# Patient Record
Sex: Male | Born: 1935 | Race: White | Hispanic: No | Marital: Married | Smoking: Former smoker
Health system: Southern US, Community
[De-identification: ages and names within clinical notes are randomized; demographics above are authoritative.]

## PROBLEM LIST (undated history)

## (undated) DIAGNOSIS — I10 Essential (primary) hypertension: Secondary | ICD-10-CM

## (undated) DIAGNOSIS — M1990 Unspecified osteoarthritis, unspecified site: Secondary | ICD-10-CM

## (undated) HISTORY — PX: CATARACT EXTRACTION: SUR2

## (undated) HISTORY — PX: APPENDECTOMY: SHX54

## (undated) HISTORY — PX: TONSILLECTOMY: SUR1361

## (undated) HISTORY — PX: OTHER SURGICAL HISTORY: SHX169

## (undated) HISTORY — PX: ADENOIDECTOMY: SHX5191

## (undated) HISTORY — PX: COLONOSCOPY: SHX174

---

## 2004-03-04 ENCOUNTER — Ambulatory Visit: Payer: Self-pay | Admitting: Family Medicine

## 2004-06-11 ENCOUNTER — Ambulatory Visit: Payer: Self-pay | Admitting: Family Medicine

## 2004-09-21 ENCOUNTER — Ambulatory Visit: Payer: Self-pay | Admitting: Family Medicine

## 2005-01-05 ENCOUNTER — Ambulatory Visit: Payer: Self-pay | Admitting: Family Medicine

## 2005-04-20 ENCOUNTER — Ambulatory Visit: Payer: Self-pay | Admitting: Family Medicine

## 2005-07-26 ENCOUNTER — Ambulatory Visit: Payer: Self-pay | Admitting: Family Medicine

## 2005-10-28 ENCOUNTER — Ambulatory Visit: Payer: Self-pay | Admitting: Family Medicine

## 2006-02-03 ENCOUNTER — Ambulatory Visit: Payer: Self-pay | Admitting: Family Medicine

## 2006-03-14 ENCOUNTER — Ambulatory Visit: Payer: Self-pay | Admitting: Physician Assistant

## 2006-03-24 ENCOUNTER — Ambulatory Visit: Payer: Self-pay | Admitting: Family Medicine

## 2006-05-20 ENCOUNTER — Ambulatory Visit: Payer: Self-pay | Admitting: Family Medicine

## 2006-08-25 ENCOUNTER — Ambulatory Visit: Payer: Self-pay | Admitting: Family Medicine

## 2010-04-09 ENCOUNTER — Ambulatory Visit: Admit: 2010-04-09 | Payer: Self-pay | Admitting: Internal Medicine

## 2010-05-13 ENCOUNTER — Ambulatory Visit (INDEPENDENT_AMBULATORY_CARE_PROVIDER_SITE_OTHER): Payer: Medicare HMO | Admitting: Internal Medicine

## 2010-05-13 DIAGNOSIS — D649 Anemia, unspecified: Secondary | ICD-10-CM

## 2010-05-13 DIAGNOSIS — Z7982 Long term (current) use of aspirin: Secondary | ICD-10-CM

## 2010-05-13 DIAGNOSIS — E109 Type 1 diabetes mellitus without complications: Secondary | ICD-10-CM

## 2010-06-11 ENCOUNTER — Ambulatory Visit (HOSPITAL_COMMUNITY)
Admission: RE | Admit: 2010-06-11 | Discharge: 2010-06-11 | Disposition: A | Discharge status: Home / Self Care | Payer: Medicare HMO | Source: Ambulatory Visit | Attending: Internal Medicine | Admitting: Internal Medicine

## 2010-06-11 ENCOUNTER — Other Ambulatory Visit (INDEPENDENT_AMBULATORY_CARE_PROVIDER_SITE_OTHER): Payer: Self-pay | Admitting: Internal Medicine

## 2010-06-11 ENCOUNTER — Encounter (HOSPITAL_BASED_OUTPATIENT_CLINIC_OR_DEPARTMENT_OTHER): Payer: Medicare HMO | Admitting: Internal Medicine

## 2010-06-11 DIAGNOSIS — Z79899 Other long term (current) drug therapy: Secondary | ICD-10-CM | POA: Insufficient documentation

## 2010-06-11 DIAGNOSIS — K5520 Angiodysplasia of colon without hemorrhage: Secondary | ICD-10-CM

## 2010-06-11 DIAGNOSIS — K2270 Barrett's esophagus without dysplasia: Secondary | ICD-10-CM | POA: Insufficient documentation

## 2010-06-11 DIAGNOSIS — R195 Other fecal abnormalities: Secondary | ICD-10-CM

## 2010-06-11 DIAGNOSIS — D649 Anemia, unspecified: Secondary | ICD-10-CM

## 2010-06-11 DIAGNOSIS — E785 Hyperlipidemia, unspecified: Secondary | ICD-10-CM | POA: Insufficient documentation

## 2010-06-11 DIAGNOSIS — K921 Melena: Secondary | ICD-10-CM | POA: Insufficient documentation

## 2010-06-11 DIAGNOSIS — Z7982 Long term (current) use of aspirin: Secondary | ICD-10-CM | POA: Insufficient documentation

## 2010-06-11 DIAGNOSIS — Q2733 Arteriovenous malformation of digestive system vessel: Secondary | ICD-10-CM | POA: Insufficient documentation

## 2010-06-11 DIAGNOSIS — E119 Type 2 diabetes mellitus without complications: Secondary | ICD-10-CM | POA: Insufficient documentation

## 2010-06-11 DIAGNOSIS — D509 Iron deficiency anemia, unspecified: Secondary | ICD-10-CM | POA: Insufficient documentation

## 2010-06-11 DIAGNOSIS — I10 Essential (primary) hypertension: Secondary | ICD-10-CM | POA: Insufficient documentation

## 2010-06-30 NOTE — Op Note (Signed)
NAME:  Blake Collins, Blake Collins                 ACCOUNT NO.:  [id number]  MEDICAL RECORD NO.:  [id number]           PATIENT TYPE:  O  LOCATION:  DAYP                          FACILITY:  [facility]  PHYSICIAN:  Lionel December, M.D.    DATE OF BIRTH:  1935/03/01  DATE OF PROCEDURE: DATE OF DISCHARGE:                              OPERATIVE REPORT   PROCEDURE:  Colonoscopy followed by esophagogastroduodenoscopy.  INDICATIONS:  Blake Collins is a 76 year old Caucasian male who was recently found to have heme-positive stools and subsequently noted to have anemia, with microcytic anemia and iron studies suggesting iron deficiency.  He does not have any GI symptoms other than occasional hematochezia.  He is undergoing diagnostic colonoscopy, if normal, to be followed by esophagogastroduodenoscopy.  The patient says, he is on a study drug through [facility], but no further details are available.  Procedure was reviewed with the patient.  Informed consent was obtained.  MEDICATIONS FOR CONSCIOUS SEDATION:  Demerol 50 mg IV and Versed 5 mg IV.  FINDINGS:  Procedure performed in endoscopy suite.  The patient's vital signs and O2 sat were monitored during the procedure and remained stable.  Procedure #1.  Colonoscopy.  Rectal examination performed.  No abnormality noted on external or digital exam.  Pentax videoscope was placed through rectum and advanced under vision into sigmoid colon and beyond.  Preparation was excellent.  Scope was passed into the cecum which was identified by appendiceal orifice and ileocecal valve.  There was a small AV malformation at cecum without stigmata of bleeding, it was left alone.  Short segment of GI was also examined and it was normal.  As the scope was withdrawn, colonic mucosa was once again carefully examined, and no polyps, tumor masses, or other mucosal abnormalities were noted.  Rectal mucosa was normal.  Scope was retroflexed to examine anorectal junction  which was unremarkable. Endoscope was withdrawn.  The patient was prepared for procedure #2.  Esophagogastroduodenoscopy.  The patient was placed in left lateral recumbent position, and Pentax videoscope was passed via oropharynx without any difficulty into the esophagus.  Mucosa of the esophagus was normal except distally it had about 10 x 15 mm patch of salmon-colored mucosa.  GE junction was located at 40 cm from the incisors.  This patch of abnormal-appearing mucosa was biopsied on the way out for routine histology.  Stomach.  It was empty and distended very well with insufflation.  Folds of proximal stomach are normal.  Examination of mucosa revealed focal, maybe a 2-mm area with edema, but no erosions, ulcers, or other abnormalities noted.  Pyloric channel was patent.  Angularis, fundus, and cardia were examined by retroflexing the scope and were normal.  Duodenum.  Bulbar mucosa was normal.  Scope was passed to second part of duodenal mucosa, and folds were normal.  Endoscope was withdrawn.  The patient tolerated the procedures well.  FINAL DIAGNOSES:  Small cecal arteriovenous malformation, otherwise normal colonoscopy.  Doubt this has anything to do with the patient's iron deficiency anemia or heme-positive stools.  No evidence of peptic ulcer disease or telangiectasia.  Single patch of  salmon-colored mucosa at distal esophagus, which was biopsied for histology to rule out short-segment Barrett's.  RECOMMENDATIONS:  The patient will resume his usual medications and diet as before.  He will start ferrous sulfate 325 mg one twice daily with meals.  I will be contacting the patient with results of biopsy and plan to repeat his H and H in 1 month.     Lionel December, M.D.     NR/MEDQ  D:  06/11/2010  T:  06/11/2010  Job:  [id number]  cc:   Blake Collins, M.D. Fax: [phone]  Electronically Signed by Lionel December M.D. on 06/30/2010 11:33:00 AM

## 2010-06-30 NOTE — Consult Note (Signed)
NAME:  Blake Collins, Blake Collins                 ACCOUNT NO.:  [id number]  MEDICAL RECORD NO.:  [id number]           PATIENT TYPE:  LOCATION:    Office                            FACILITY:  PHYSICIAN:  Lionel December, M.D.    DATE OF BIRTH:  1934/11/07  DATE OF CONSULTATION: DATE OF DISCHARGE:                                CONSULTATION   REASON FOR CONSULTATION:  Anemia.  HISTORY OF PRESENT ILLNESS:  Mr. Blake Collins is a 76 year old male referred to our office by Dr. Lysbeth Galas for anemia.  In November 2011, it was noted his hemoglobin was 10.8 and hematocrit was 34.4.  On December 7 it was noted his hemoglobin was 10.7 and hematocrit was 35.9, his MCV was 75. On December 13; his hemoglobin was 10.3, hematocrit was 36.2 and his MCV was 78.3.  Iron studies, his iron was 34, TIBC was 435, percent of saturation was 8.  Sodium 139, potassium 4.4, chloride 103, CO2 24, glucose 155, BUN 14, creatinine 0.78, total bili 0.9, ALP 59, AST 21, ALT 18, total protein 6.4, albumin 4.2 and calcium was 9.0.  He was started on iron in December and has finished since then.  Mr. Blake Collins denies a prior history of anemia.  His appetite has been good.  He has had no weight loss.  He has a bowel movement once a day and sometimes two.  His stools are brown in color, normal caliber.  He does state that he did have a positive stool for blood in Dr. Joyce Copa office recently. He denies any acid reflux.  No abdominal pain.  His last colonoscopy was in 2008 by Dr. Cleotis Nipper, screening which revealed a normal colonoscopy. There was no diverticulosis, no colonic or rectal polyps, normal colon otherwise.  There is no known allergies.  HOME MEDICATIONS: 1. Metformin 1000 mg twice a day. 2. Hydrocodone as needed. 3. Glipizide 10 mg a day. 4. Lisinopril 20 mg a day. 5. Clonazepam 1 mg at night. 6. Lipitor 40 mg a day. 7. Aspirin 81 mg.  SURGERIES:  He has had right cataract surgery and he has had upper eyelid surgery.  MEDICAL  HISTORY:  Diabetes type 2 for 10 years and high cholesterol.  FAMILY HISTORY:  His mother and father deceased from natural causes.  He has 4 sisters, 2 were deceased, 1 form kidney failure and from natural causes, 2 are in good health except 1 has skin cancer and 1 has mental problems.  He has 1 brother with diverticulosis and diabetes.  He is married.  He is retired from [employer].  He does not smoke, drink or do drugs and he has 3 children in good health.  OBJECTIVE:  VITAL SIGNS:  His weight is 293, his height is 5 feet 9-1/2 inches, temperature is 98, blood pressure is 170/90, his pulse is 80. HEENT:  He has lower dentures.  He does not wear.  He has natural teeth in the lower.  His oral mucosa is moist.  There are no lesions.  His conjunctivae are pink.  His sclerae are anicteric. NECK:  His thyroid is normal.  There is no  cervical lymphadenopathy. LUNGS:  Clear. ABDOMEN:  Soft.  Bowel sounds are positive.  No masses, obese. HEART:  Regular rate and rhythm.  There were no stools in the rectum and it was guaiac negative.ASSESSMENT:  Mr. Blake Collins is a 76 year old male with a new onset diagnosis of anemia.  He did have a guaiac positive stool with Dr. Lysbeth Galas recently.  A colonic neoplasm needs to be ruled out.  Peptic ulcer disease also was in the differential as well as rectal AVM or colonic AVM or ulcer.  RECOMMENDATIONS:  We will schedule a colonoscopy with Dr. Karilyn Cota in the near future.  If the colonoscopy is normal, he will need an EGD and the risk and benefits were reviewed with the patient and he is agreeable.    ______________________________ Dorene Ar, NP   ______________________________ Lionel December, M.D.    TS/MEDQ  D:  05/13/2010  T:  05/14/2010  Job:  [id number]  Electronically Signed by Dorene Ar PA on 06/12/2010 10:53:07 AM Electronically Signed by Lionel December M.D. on 06/30/2010 11:32:09 AM

## 2011-02-17 ENCOUNTER — Encounter: Payer: Self-pay | Admitting: *Deleted

## 2011-02-17 ENCOUNTER — Emergency Department (HOSPITAL_COMMUNITY): Payer: Medicare HMO

## 2011-02-17 ENCOUNTER — Emergency Department (HOSPITAL_COMMUNITY)
Admission: EM | Admit: 2011-02-17 | Discharge: 2011-02-17 | Disposition: A | Discharge status: Home / Self Care | Payer: Medicare HMO | Attending: Emergency Medicine | Admitting: Emergency Medicine

## 2011-02-17 DIAGNOSIS — E119 Type 2 diabetes mellitus without complications: Secondary | ICD-10-CM | POA: Insufficient documentation

## 2011-02-17 DIAGNOSIS — M545 Low back pain, unspecified: Secondary | ICD-10-CM | POA: Insufficient documentation

## 2011-02-17 DIAGNOSIS — M129 Arthropathy, unspecified: Secondary | ICD-10-CM | POA: Insufficient documentation

## 2011-02-17 DIAGNOSIS — Z87891 Personal history of nicotine dependence: Secondary | ICD-10-CM | POA: Insufficient documentation

## 2011-02-17 DIAGNOSIS — K8020 Calculus of gallbladder without cholecystitis without obstruction: Secondary | ICD-10-CM | POA: Insufficient documentation

## 2011-02-17 DIAGNOSIS — I10 Essential (primary) hypertension: Secondary | ICD-10-CM | POA: Insufficient documentation

## 2011-02-17 DIAGNOSIS — R109 Unspecified abdominal pain: Secondary | ICD-10-CM | POA: Insufficient documentation

## 2011-02-17 DIAGNOSIS — N200 Calculus of kidney: Secondary | ICD-10-CM | POA: Insufficient documentation

## 2011-02-17 HISTORY — DX: Essential (primary) hypertension: I10

## 2011-02-17 HISTORY — DX: Unspecified osteoarthritis, unspecified site: M19.90

## 2011-02-17 LAB — URINALYSIS, ROUTINE W REFLEX MICROSCOPIC
Glucose, UA: NEGATIVE mg/dL
Protein, ur: NEGATIVE mg/dL
Specific Gravity, Urine: 1.015 (ref 1.005–1.030)
pH: 6 (ref 5.0–8.0)

## 2011-02-17 LAB — URINE MICROSCOPIC-ADD ON

## 2011-02-17 MED ORDER — CYCLOBENZAPRINE HCL 5 MG PO TABS: 5.0000 mg | ORAL_TABLET | Freq: Three times a day (TID) | ORAL | Status: AC | PRN

## 2011-02-17 MED ORDER — OXYCODONE-ACETAMINOPHEN 5-325 MG PO TABS: 1.0000 | ORAL_TABLET | Freq: Four times a day (QID) | ORAL | Status: AC | PRN

## 2011-02-17 MED ORDER — OXYCODONE-ACETAMINOPHEN 5-325 MG PO TABS
1.0000 | ORAL_TABLET | Freq: Once | ORAL | Status: AC
  Administered 2011-02-17: 1 via ORAL
  Filled 2011-02-17: qty 1

## 2011-02-17 NOTE — ED Notes (Signed)
Pt c/o r lower back pain x 5 days.  Denies injury.  Denies urinary symptoms.  Denies history of kidney stones.  Reports pain is worse when he moves.  Says feels like it catches when he walks .

## 2011-02-17 NOTE — ED Notes (Signed)
Pt c/o lower back pain that started a few days ago, worse during the night, denies any injury, states that he has had back problems before but nothing like this,

## 2011-02-17 NOTE — ED Provider Notes (Signed)
History     CSN: [id number] Arrival date & time: 02/17/2011  1:03 PM   First MD Initiated Contact with Patient 02/17/11 1307      Chief Complaint  Patient presents with  . Back Pain  . Flank Pain    (Consider location/radiation/quality/duration/timing/severity/associated sxs/prior treatment) HPI Patient presents with complaint of right-sided low back pain. He states the pain has been present for approximately 5 days and has been gradually worsening. He denies any fever or loss of control of bowel or bladder. He denies any weakness of his leg. He denies any blood in his urine or dysuria. He does not know of any trauma or changes in his activities. He has had a milder version of this pain in the past. Movement and palpation make the pain worse. He has not tried any medications to help with the pain.  Past Medical History  Diagnosis Date  . Diabetes mellitus   . Arthritis   . Hypertension     Past Surgical History  Procedure Date  . Cataract extraction   . Appendectomy   . Tonsillectomy   . Adenoidectomy   . Colonoscopy   . Endo     History reviewed. No pertinent family history.  History  Substance Use Topics  . Smoking status: Former Games developer  . Smokeless tobacco: Not on file  . Alcohol Use: No      Review of Systems ROS reviewed and otherwise negative except for mentioned in HPI  Allergies  Review of patient's allergies indicates no known allergies.  Home Medications  No current outpatient prescriptions on file.  BP 143/71  Pulse 85  Temp(Src) 97.5 F (36.4 C) (Oral)  Resp 20  Ht 5' 9.5" (1.765 m)  Wt 270 lb (122.471 kg)  BMI 39.30 kg/m2  SpO2 97% Vitals reviewed Physical Exam Physical Examination: General appearance - alert, well appearing, and in no distress Mental status - alert, oriented to person, place, and time Chest - clear to auscultation, no wheezes, rales or rhonchi, symmetric air entry Heart - normal rate, regular rhythm, normal S1, S2,  no murmurs, rubs, clicks or gallops Abdomen - soft, nontender, nondistended, no masses or organomegaly Back exam - moderate ttp over lumbar spine, and right paraspinous muscles, no CVA tenderness Neurological - alert, oriented, normal speech, no focal findings or movement disorder noted, 5/5 strength in lower extremities bilaterally, sensation intact to light touch Musculoskeletal - no joint tenderness, deformity or swelling Extremities - peripheral pulses normal, no pedal edema, no clubbing or cyanosis Skin - normal coloration and turgor, no rashes  ED Course  Procedures (including critical care time)   Labs Reviewed  URINALYSIS, ROUTINE W REFLEX MICROSCOPIC   No results found.   No diagnosis found.    MDM  Patient presenting with low right-sided back pain which has been present approximately 5 days. Lumbar spine films reveals some degenerative changes but no acute findings. Urine did have blood present and there was question of renal stone on plain film. CT of the abdomen revealed cholelithiasis and small renal stone but no ureteral stone or hydronephrosis. I suspect his pain is due to musculoskeletal the back pain. Will discharge with pain control and muscle relaxation. He was given strict return precautions and he is agreeable with this plan.  He was also notified of findings of cholelithiasis on CT scan.  He has no signs or symptoms of cauda equina        Ethelda Chick, MD 02/17/11 [phone]

## 2013-02-13 ENCOUNTER — Emergency Department (HOSPITAL_COMMUNITY): Payer: Medicare Other

## 2013-02-13 ENCOUNTER — Emergency Department (HOSPITAL_COMMUNITY)
Admission: EM | Admit: 2013-02-13 | Discharge: 2013-02-13 | Disposition: A | Discharge status: Home / Self Care | Payer: Medicare Other | Attending: Emergency Medicine | Admitting: Emergency Medicine

## 2013-02-13 ENCOUNTER — Encounter (HOSPITAL_COMMUNITY): Payer: Self-pay | Admitting: Emergency Medicine

## 2013-02-13 DIAGNOSIS — Z9089 Acquired absence of other organs: Secondary | ICD-10-CM | POA: Insufficient documentation

## 2013-02-13 DIAGNOSIS — M129 Arthropathy, unspecified: Secondary | ICD-10-CM | POA: Insufficient documentation

## 2013-02-13 DIAGNOSIS — Z7982 Long term (current) use of aspirin: Secondary | ICD-10-CM | POA: Insufficient documentation

## 2013-02-13 DIAGNOSIS — Z79899 Other long term (current) drug therapy: Secondary | ICD-10-CM | POA: Insufficient documentation

## 2013-02-13 DIAGNOSIS — E119 Type 2 diabetes mellitus without complications: Secondary | ICD-10-CM | POA: Insufficient documentation

## 2013-02-13 DIAGNOSIS — Z9889 Other specified postprocedural states: Secondary | ICD-10-CM | POA: Insufficient documentation

## 2013-02-13 DIAGNOSIS — K625 Hemorrhage of anus and rectum: Secondary | ICD-10-CM | POA: Insufficient documentation

## 2013-02-13 DIAGNOSIS — Z87891 Personal history of nicotine dependence: Secondary | ICD-10-CM | POA: Insufficient documentation

## 2013-02-13 DIAGNOSIS — K5900 Constipation, unspecified: Secondary | ICD-10-CM | POA: Insufficient documentation

## 2013-02-13 DIAGNOSIS — R109 Unspecified abdominal pain: Secondary | ICD-10-CM | POA: Insufficient documentation

## 2013-02-13 DIAGNOSIS — I10 Essential (primary) hypertension: Secondary | ICD-10-CM | POA: Insufficient documentation

## 2013-02-13 LAB — CBC WITH DIFFERENTIAL/PLATELET
Basophils Relative: 0 % (ref 0–1)
Eosinophils Absolute: 0.1 10*3/uL (ref 0.0–0.7)
Lymphs Abs: 1.5 10*3/uL (ref 0.7–4.0)
MCH: 29.8 pg (ref 26.0–34.0)
Neutrophils Relative %: 78 % — ABNORMAL HIGH (ref 43–77)
Platelets: 156 10*3/uL (ref 150–400)
RBC: 4.8 MIL/uL (ref 4.22–5.81)

## 2013-02-13 LAB — COMPREHENSIVE METABOLIC PANEL
ALT: 22 U/L (ref 0–53)
Albumin: 3.9 g/dL (ref 3.5–5.2)
Alkaline Phosphatase: 61 U/L (ref 39–117)
Glucose, Bld: 111 mg/dL — ABNORMAL HIGH (ref 70–99)
Potassium: 4.3 mEq/L (ref 3.5–5.1)
Sodium: 136 mEq/L (ref 135–145)
Total Protein: 7.2 g/dL (ref 6.0–8.3)

## 2013-02-13 MED ORDER — IOHEXOL 300 MG/ML  SOLN
100.0000 mL | Freq: Once | INTRAMUSCULAR | Status: AC | PRN
  Administered 2013-02-13: 100 mL via INTRAVENOUS

## 2013-02-13 MED ORDER — IOHEXOL 300 MG/ML  SOLN
50.0000 mL | Freq: Once | INTRAMUSCULAR | Status: AC | PRN
  Administered 2013-02-13: 50 mL via ORAL

## 2013-02-13 NOTE — ED Notes (Addendum)
Pt with abd pain and states LNBM was the other day, took enema just PTA today, denies N/V

## 2013-02-13 NOTE — ED Provider Notes (Addendum)
CSN: [id number]     Arrival date & time 02/13/13  1518 History  This chart was scribed for Gilda Crease, * by Ronal Fear, ED Scribe. This patient was seen in room [facility] and the patient's care was started at 3:31 PM.    Chief Complaint  Patient presents with  . Abdominal Pain  . Constipation   (Consider location/radiation/quality/duration/timing/severity/associated sxs/prior Treatment) The history is provided by the patient. No language interpreter was used.    HPI Comments: Blake Collins is a 78 y.o. male who presents to the Emergency Department complaining of crampy bilateral lower abdominal pain and the feeling of constipation that is worse with attempting BM onset yesterday. He has given himself an enema with no relief.  Pt states that there has been some liquid discharge with a saw dust like secretion and traces of blood. He denies emesis or fever, CP or trouble breathing. He has had no prior occurences with a similar constipation. He denies a hx of diverticulitis. But, he has a family hx of diverticulitis. Pt has had a colonoscopy in the past with no remarkable results.   Past Medical History  Diagnosis Date  . Diabetes mellitus   . Arthritis   . Hypertension    Past Surgical History  Procedure Laterality Date  . Cataract extraction    . Appendectomy    . Tonsillectomy    . Adenoidectomy    . Colonoscopy    . Endo     History reviewed. No pertinent family history. History  Substance Use Topics  . Smoking status: Former Games developer  . Smokeless tobacco: Not on file  . Alcohol Use: No    Review of Systems  Constitutional: Negative for fever.  Respiratory: Negative for shortness of breath.   Cardiovascular: Negative for chest pain.  Gastrointestinal: Positive for abdominal pain and constipation. Negative for vomiting and diarrhea.  All other systems reviewed and are negative.    Allergies  Review of patient's allergies indicates no known  allergies.  Home Medications   Current Outpatient Rx  Name  Route  Sig  Dispense  Refill  . acetaminophen (TYLENOL) 650 MG CR tablet   Oral   Take 1,300 mg by mouth every 8 (eight) hours as needed. For pain          . aspirin EC 81 MG tablet   Oral   Take 81 mg by mouth daily.           [name] atorvastatin (LIPITOR) 40 MG tablet   Oral   Take 40 mg by mouth at bedtime.           . clonazePAM (KLONOPIN) 1 MG tablet   Oral   Take 1 mg by mouth at bedtime.           [name] glipiZIDE (GLUCOTROL XL) 10 MG 24 hr tablet   Oral   Take 10 mg by mouth daily.           [name] HYDROcodone-acetaminophen (VICODIN) 5-500 MG per tablet   Oral   Take 1 tablet by mouth 2 (two) times daily as needed. For pain          . lisinopril (PRINIVIL,ZESTRIL) 20 MG tablet   Oral   Take 20 mg by mouth daily.           [name] loratadine (CLARITIN) 10 MG tablet   Oral   Take 10 mg by mouth daily.           . metFORMIN (  GLUCOPHAGE) 1000 MG tablet   Oral   Take 1,000 mg by mouth 2 (two) times daily with a meal.            BP 170/81  Pulse 100  Temp(Src) 97.6 F (36.4 C) (Oral)  Resp 20  Ht 5\' 10"  (1.778 m)  Wt 240 lb (108.863 kg)  BMI 34.44 kg/m2  SpO2 97% Physical Exam  Constitutional: He is oriented to person, place, and time. He appears well-developed and well-nourished. No distress.  HENT:  Head: Normocephalic and atraumatic.  Right Ear: Hearing normal.  Left Ear: Hearing normal.  Nose: Nose normal.  Mouth/Throat: Oropharynx is clear and moist and mucous membranes are normal.  Eyes: Conjunctivae and EOM are normal. Pupils are equal, round, and reactive to light.  Neck: Normal range of motion. Neck supple.  Cardiovascular: Regular rhythm, S1 normal and S2 normal.  Exam reveals no gallop and no friction rub.   No murmur heard. Pulmonary/Chest: Effort normal and breath sounds normal. No respiratory distress. He exhibits no tenderness.  Abdominal: Soft. Normal appearance and bowel sounds  are normal. There is no hepatosplenomegaly. There is no tenderness. There is no rebound, no guarding, no tenderness at McBurney's point and negative Murphy's sign. No hernia.  Genitourinary: Rectal exam shows no mass, no tenderness and anal tone normal. Guaiac positive stool.  No impaction noted  Musculoskeletal: Normal range of motion.  Neurological: He is alert and oriented to person, place, and time. He has normal strength. No cranial nerve deficit or sensory deficit. Coordination normal. GCS eye subscore is 4. GCS verbal subscore is 5. GCS motor subscore is 6.  Skin: Skin is warm, dry and intact. No rash noted. No cyanosis.  Psychiatric: He has a normal mood and affect. His speech is normal and behavior is normal. Thought content normal.    ED Course  Procedures (including critical care time) DIAGNOSTIC STUDIES: Oxygen Saturation is 97% on RA, normal by my interpretation.    COORDINATION OF CARE:    3:34 PM- Pt advised of plan for treatment including CBC, X-ray and rectal exam and pt agrees.  Labs Review Labs Reviewed  CBC WITH DIFFERENTIAL - Abnormal; Notable for the following:    Neutrophils Relative % 78 (*)    Neutro Abs 7.8 (*)    All other components within normal limits  COMPREHENSIVE METABOLIC PANEL - Abnormal; Notable for the following:    Glucose, Bld 111 (*)    GFR calc non Af Amer 88 (*)    All other components within normal limits  LIPASE, BLOOD   Imaging Review Ct Abdomen Pelvis W Contrast  02/13/2013   CLINICAL DATA:  Lower abdominal cramping pain. Constipation. Positive Hemoccult test. Diabetes.  EXAM: CT ABDOMEN AND PELVIS WITH CONTRAST  TECHNIQUE: Multidetector CT imaging of the abdomen and pelvis was performed using the standard protocol following bolus administration of intravenous contrast.  CONTRAST:  100 mL OMNIPAQUE IOHEXOL 300 MG/ML SOLN, OMNIPAQUE IOHEXOL 300 MG/ML SOLN  COMPARISON:  02/13/2013 radiographs; CT scan of 02/17/2011.  FINDINGS:  Stable 2.5 x 1.6 cm cyst, segment 2 of the liver. Stable tiny hypodense lesion in segment 4 a.  The spleen, pancreas, and adrenal glands appear normal. Stable bilateral renal cysts. No renal calculi or ureteral calculi. Urinary bladder unremarkable. Prominent prostate gland measures 5.5 by 4.6 cm.  Scattered small peripancreatic lymph nodes are not pathologically enlarged. An 8 mm gallstone is present in the gallbladder. No biliary dilatation.  Aortoiliac atherosclerotic vascular disease noted.  No pathologic pelvic adenopathy is observed.  Appendix normal. No dilated bowel. Degenerative disk disease and spondylosis noted at L5-S1.  IMPRESSION: 1. Stable hepatic and renal cysts. 2. Cholelithiasis. 3. Spondylosis and degenerative disc disease at L5-S1. 4. Atherosclerosis. 5. Prominent prostate gland.   Electronically Signed   By: Herbie Baltimore M.D.   On: 02/13/2013 19:03   Dg Abd Acute W/chest  02/13/2013   CLINICAL DATA:  Abdominal distention and pain.  EXAM: ACUTE ABDOMEN SERIES (ABDOMEN 2 VIEW & CHEST 1 VIEW)  COMPARISON:  CT abdomen and pelvis 02/17/2011.  FINDINGS: Single view of the chest demonstrates clear lungs and normal heart size. No pneumothorax or pleural fluid is identified.  Two views of the abdomen show no free intraperitoneal air. The bowel gas pattern is unremarkable. Single rounded calcification in the right upper quadrant measuring 0.8 cm in diameter is consistent with a gallstone seen on the prior CT.  IMPRESSION: No acute finding chest or abdomen.  Single gallstone as seen on prior CT scan.   Electronically Signed   By: Drusilla Kanner M.D.   On: 02/13/2013 17:26    EKG Interpretation   None       MDM  Diagnosis: Abdominal pain  Patient presents to ER with complaints of abdominal pain. Patient complains of cramping in his lower abdomen that is present when he tries to defecate. He feels constipated, but cannot have a bowel movement. Last normal bowel movement was several days  ago. Rectal exam did not reveal any fecal impaction. X-ray was unremarkable and blood work was normal. Based on this CAT scan was performed to further evaluate for diverticulitis, colitis, other etiologies. CAT scan does not show any acute process. Patient reassured, no further enemas or laxatives to be used.   Patient's rectal exam was heme positive. He has seen some streaking of blood in stools. He reports a previous colonoscopy several years ago in [place]. He was told it was normal. Patient will require followup, likely colonoscopy. Will be referred to GI.  I personally performed the services described in this documentation, which was scribed in my presence. The recorded information has been reviewed and is accurate.    Gilda Crease, MD 02/13/13 1940  Gilda Crease, MD 02/13/13 [id number]

## 2019-06-23 ENCOUNTER — Emergency Department (HOSPITAL_COMMUNITY): Payer: Medicare HMO

## 2019-06-23 ENCOUNTER — Inpatient Hospital Stay (HOSPITAL_COMMUNITY)
Admission: EM | Admit: 2019-06-23 | Discharge: 2019-07-07 | DRG: 177 | Disposition: A | Discharge status: Skilled Nursing Facility | Payer: Medicare HMO | Attending: Family Medicine | Admitting: Family Medicine

## 2019-06-23 ENCOUNTER — Encounter (HOSPITAL_COMMUNITY): Payer: Self-pay | Admitting: Emergency Medicine

## 2019-06-23 ENCOUNTER — Other Ambulatory Visit: Payer: Self-pay

## 2019-06-23 DIAGNOSIS — Z7984 Long term (current) use of oral hypoglycemic drugs: Secondary | ICD-10-CM

## 2019-06-23 DIAGNOSIS — Z23 Encounter for immunization: Secondary | ICD-10-CM | POA: Diagnosis not present

## 2019-06-23 DIAGNOSIS — I2699 Other pulmonary embolism without acute cor pulmonale: Secondary | ICD-10-CM | POA: Diagnosis not present

## 2019-06-23 DIAGNOSIS — M1990 Unspecified osteoarthritis, unspecified site: Secondary | ICD-10-CM | POA: Diagnosis present

## 2019-06-23 DIAGNOSIS — J440 Chronic obstructive pulmonary disease with acute lower respiratory infection: Secondary | ICD-10-CM | POA: Diagnosis present

## 2019-06-23 DIAGNOSIS — R4182 Altered mental status, unspecified: Secondary | ICD-10-CM | POA: Diagnosis not present

## 2019-06-23 DIAGNOSIS — I1310 Hypertensive heart and chronic kidney disease without heart failure, with stage 1 through stage 4 chronic kidney disease, or unspecified chronic kidney disease: Secondary | ICD-10-CM | POA: Diagnosis present

## 2019-06-23 DIAGNOSIS — Z9849 Cataract extraction status, unspecified eye: Secondary | ICD-10-CM

## 2019-06-23 DIAGNOSIS — R413 Other amnesia: Secondary | ICD-10-CM | POA: Diagnosis not present

## 2019-06-23 DIAGNOSIS — K922 Gastrointestinal hemorrhage, unspecified: Secondary | ICD-10-CM

## 2019-06-23 DIAGNOSIS — U071 COVID-19: Principal | ICD-10-CM | POA: Diagnosis present

## 2019-06-23 DIAGNOSIS — D649 Anemia, unspecified: Secondary | ICD-10-CM | POA: Diagnosis present

## 2019-06-23 DIAGNOSIS — Z87891 Personal history of nicotine dependence: Secondary | ICD-10-CM

## 2019-06-23 DIAGNOSIS — K921 Melena: Secondary | ICD-10-CM | POA: Diagnosis present

## 2019-06-23 DIAGNOSIS — E785 Hyperlipidemia, unspecified: Secondary | ICD-10-CM | POA: Diagnosis present

## 2019-06-23 DIAGNOSIS — F05 Delirium due to known physiological condition: Secondary | ICD-10-CM | POA: Diagnosis not present

## 2019-06-23 DIAGNOSIS — F0390 Unspecified dementia without behavioral disturbance: Secondary | ICD-10-CM | POA: Diagnosis present

## 2019-06-23 DIAGNOSIS — I454 Nonspecific intraventricular block: Secondary | ICD-10-CM | POA: Diagnosis present

## 2019-06-23 DIAGNOSIS — R7989 Other specified abnormal findings of blood chemistry: Secondary | ICD-10-CM

## 2019-06-23 DIAGNOSIS — E869 Volume depletion, unspecified: Secondary | ICD-10-CM | POA: Diagnosis present

## 2019-06-23 DIAGNOSIS — Z79899 Other long term (current) drug therapy: Secondary | ICD-10-CM

## 2019-06-23 DIAGNOSIS — Z8719 Personal history of other diseases of the digestive system: Secondary | ICD-10-CM

## 2019-06-23 DIAGNOSIS — Z9089 Acquired absence of other organs: Secondary | ICD-10-CM

## 2019-06-23 DIAGNOSIS — G8929 Other chronic pain: Secondary | ICD-10-CM | POA: Diagnosis present

## 2019-06-23 DIAGNOSIS — J9600 Acute respiratory failure, unspecified whether with hypoxia or hypercapnia: Secondary | ICD-10-CM | POA: Diagnosis not present

## 2019-06-23 DIAGNOSIS — J9601 Acute respiratory failure with hypoxia: Secondary | ICD-10-CM | POA: Diagnosis present

## 2019-06-23 DIAGNOSIS — G9341 Metabolic encephalopathy: Secondary | ICD-10-CM | POA: Diagnosis not present

## 2019-06-23 DIAGNOSIS — E1122 Type 2 diabetes mellitus with diabetic chronic kidney disease: Secondary | ICD-10-CM | POA: Diagnosis present

## 2019-06-23 DIAGNOSIS — N1832 Chronic kidney disease, stage 3b: Secondary | ICD-10-CM | POA: Diagnosis present

## 2019-06-23 DIAGNOSIS — N179 Acute kidney failure, unspecified: Secondary | ICD-10-CM | POA: Diagnosis present

## 2019-06-23 DIAGNOSIS — M545 Low back pain: Secondary | ICD-10-CM | POA: Diagnosis present

## 2019-06-23 DIAGNOSIS — R531 Weakness: Secondary | ICD-10-CM

## 2019-06-23 DIAGNOSIS — Z7982 Long term (current) use of aspirin: Secondary | ICD-10-CM

## 2019-06-23 DIAGNOSIS — H9190 Unspecified hearing loss, unspecified ear: Secondary | ICD-10-CM | POA: Diagnosis present

## 2019-06-23 DIAGNOSIS — J1282 Pneumonia due to coronavirus disease 2019: Secondary | ICD-10-CM | POA: Diagnosis present

## 2019-06-23 DIAGNOSIS — Z79891 Long term (current) use of opiate analgesic: Secondary | ICD-10-CM

## 2019-06-23 DIAGNOSIS — Z781 Physical restraint status: Secondary | ICD-10-CM

## 2019-06-23 DIAGNOSIS — Z9049 Acquired absence of other specified parts of digestive tract: Secondary | ICD-10-CM

## 2019-06-23 DIAGNOSIS — G4733 Obstructive sleep apnea (adult) (pediatric): Secondary | ICD-10-CM | POA: Diagnosis present

## 2019-06-23 DIAGNOSIS — R8281 Pyuria: Secondary | ICD-10-CM | POA: Diagnosis present

## 2019-06-23 LAB — CBC WITH DIFFERENTIAL/PLATELET
Abs Immature Granulocytes: 0.1 10*3/uL — ABNORMAL HIGH (ref 0.00–0.07)
Basophils Absolute: 0 10*3/uL (ref 0.0–0.1)
Basophils Relative: 0 %
Eosinophils Absolute: 0 10*3/uL (ref 0.0–0.5)
Eosinophils Relative: 0 %
HCT: 32.5 % — ABNORMAL LOW (ref 39.0–52.0)
Hemoglobin: 10 g/dL — ABNORMAL LOW (ref 13.0–17.0)
Immature Granulocytes: 1 %
Lymphocytes Relative: 9 %
Lymphs Abs: 0.8 10*3/uL (ref 0.7–4.0)
MCH: 25.9 pg — ABNORMAL LOW (ref 26.0–34.0)
MCHC: 30.8 g/dL (ref 30.0–36.0)
MCV: 84.2 fL (ref 80.0–100.0)
Monocytes Absolute: 0.7 10*3/uL (ref 0.1–1.0)
Monocytes Relative: 8 %
Neutro Abs: 6.7 10*3/uL (ref 1.7–7.7)
Neutrophils Relative %: 82 %
Platelets: 277 10*3/uL (ref 150–400)
RBC: 3.86 MIL/uL — ABNORMAL LOW (ref 4.22–5.81)
RDW: 15.6 % — ABNORMAL HIGH (ref 11.5–15.5)
WBC: 8.3 10*3/uL (ref 4.0–10.5)
nRBC: 0 % (ref 0.0–0.2)

## 2019-06-23 LAB — COMPREHENSIVE METABOLIC PANEL
ALT: 13 U/L (ref 0–44)
AST: 28 U/L (ref 15–41)
Albumin: 2.9 g/dL — ABNORMAL LOW (ref 3.5–5.0)
Alkaline Phosphatase: 76 U/L (ref 38–126)
Anion gap: 12 (ref 5–15)
BUN: 37 mg/dL — ABNORMAL HIGH (ref 8–23)
CO2: 20 mmol/L — ABNORMAL LOW (ref 22–32)
Calcium: 8.5 mg/dL — ABNORMAL LOW (ref 8.9–10.3)
Chloride: 108 mmol/L (ref 98–111)
Creatinine, Ser: 1.85 mg/dL — ABNORMAL HIGH (ref 0.61–1.24)
GFR calc Af Amer: 38 mL/min — ABNORMAL LOW (ref >60)
GFR calc non Af Amer: 33 mL/min — ABNORMAL LOW (ref >60)
Glucose, Bld: 134 mg/dL — ABNORMAL HIGH (ref 70–99)
Potassium: 4.1 mmol/L (ref 3.5–5.1)
Sodium: 140 mmol/L (ref 135–145)
Total Bilirubin: 0.7 mg/dL (ref 0.3–1.2)
Total Protein: 7.3 g/dL (ref 6.5–8.1)

## 2019-06-23 LAB — TYPE AND SCREEN
ABO/RH(D): A POS
Antibody Screen: NEGATIVE

## 2019-06-23 LAB — RESPIRATORY PANEL BY RT PCR (FLU A&B, COVID)
Influenza A by PCR: NEGATIVE
Influenza B by PCR: NEGATIVE
SARS Coronavirus 2 by RT PCR: POSITIVE — AB

## 2019-06-23 LAB — URINALYSIS, ROUTINE W REFLEX MICROSCOPIC
Bacteria, UA: NONE SEEN
Bilirubin Urine: NEGATIVE
Glucose, UA: NEGATIVE mg/dL
Hgb urine dipstick: NEGATIVE
Ketones, ur: NEGATIVE mg/dL
Nitrite: NEGATIVE
Protein, ur: 30 mg/dL — AB
Specific Gravity, Urine: 1.02 (ref 1.005–1.030)
WBC, UA: >50 WBC/hpf — ABNORMAL HIGH (ref 0–5)
pH: 5 (ref 5.0–8.0)

## 2019-06-23 LAB — GLUCOSE, CAPILLARY
Glucose-Capillary: 110 mg/dL — ABNORMAL HIGH (ref 70–99)
Glucose-Capillary: 156 mg/dL — ABNORMAL HIGH (ref 70–99)

## 2019-06-23 LAB — POC SARS CORONAVIRUS 2 AG -  ED: SARS Coronavirus 2 Ag: NEGATIVE

## 2019-06-23 LAB — FERRITIN: Ferritin: 97 ng/mL (ref 24–336)

## 2019-06-23 LAB — HEMOGLOBIN A1C
Hgb A1c MFr Bld: 6.4 % — ABNORMAL HIGH (ref 4.8–5.6)
Mean Plasma Glucose: 136.98 mg/dL

## 2019-06-23 LAB — VITAMIN B12: Vitamin B-12: 1289 pg/mL — ABNORMAL HIGH (ref 180–914)

## 2019-06-23 LAB — PROCALCITONIN: Procalcitonin: 0.2 ng/mL

## 2019-06-23 LAB — TSH: TSH: 1.296 u[IU]/mL (ref 0.350–4.500)

## 2019-06-23 LAB — FOLATE: Folate: 15.1 ng/mL (ref >5.9)

## 2019-06-23 LAB — LACTIC ACID, PLASMA: Lactic Acid, Venous: 1.5 mmol/L (ref 0.5–1.9)

## 2019-06-23 LAB — C-REACTIVE PROTEIN: CRP: 20.8 mg/dL — ABNORMAL HIGH (ref <1.0)

## 2019-06-23 LAB — T4, FREE: Free T4: 1.03 ng/dL (ref 0.61–1.12)

## 2019-06-23 LAB — TROPONIN I (HIGH SENSITIVITY): Troponin I (High Sensitivity): 15 ng/L (ref <18)

## 2019-06-23 LAB — LACTATE DEHYDROGENASE: LDH: 253 U/L — ABNORMAL HIGH (ref 98–192)

## 2019-06-23 LAB — FIBRINOGEN: Fibrinogen: >800 mg/dL — ABNORMAL HIGH (ref 210–475)

## 2019-06-23 LAB — D-DIMER, QUANTITATIVE: D-Dimer, Quant: 3.31 ug/mL-FEU — ABNORMAL HIGH (ref 0.00–0.50)

## 2019-06-23 LAB — POC OCCULT BLOOD, ED: Fecal Occult Bld: POSITIVE — AB

## 2019-06-23 LAB — BRAIN NATRIURETIC PEPTIDE: B Natriuretic Peptide: 107 pg/mL — ABNORMAL HIGH (ref 0.0–100.0)

## 2019-06-23 MED ORDER — ONDANSETRON HCL 4 MG/2ML IJ SOLN: 4.0000 mg | Freq: Four times a day (QID) | INTRAMUSCULAR | Status: DC | PRN

## 2019-06-23 MED ORDER — ONDANSETRON HCL 4 MG PO TABS: 4.0000 mg | ORAL_TABLET | Freq: Four times a day (QID) | ORAL | Status: DC | PRN

## 2019-06-23 MED ORDER — SODIUM CHLORIDE 0.9 % IV SOLN
100.0000 mg | Freq: Every day | INTRAVENOUS | Status: AC
  Administered 2019-06-24 – 2019-06-27 (×4): 100 mg via INTRAVENOUS
  Filled 2019-06-23 (×4): qty 20

## 2019-06-23 MED ORDER — PANTOPRAZOLE SODIUM 40 MG PO TBEC
40.0000 mg | DELAYED_RELEASE_TABLET | Freq: Every day | ORAL | Status: DC
  Administered 2019-06-23 – 2019-07-07 (×8): 40 mg via ORAL
  Filled 2019-06-23 (×15): qty 1

## 2019-06-23 MED ORDER — IPRATROPIUM-ALBUTEROL 20-100 MCG/ACT IN AERS: 1.0000 | INHALATION_SPRAY | Freq: Four times a day (QID) | RESPIRATORY_TRACT | Status: DC

## 2019-06-23 MED ORDER — INSULIN ASPART 100 UNIT/ML ~~LOC~~ SOLN
0.0000 [IU] | Freq: Three times a day (TID) | SUBCUTANEOUS | Status: DC
  Administered 2019-06-24: 1 [IU] via SUBCUTANEOUS
  Administered 2019-06-24 (×2): 2 [IU] via SUBCUTANEOUS
  Administered 2019-06-25 (×2): 1 [IU] via SUBCUTANEOUS
  Administered 2019-06-26 – 2019-06-29 (×3): 2 [IU] via SUBCUTANEOUS
  Administered 2019-06-29: 1 [IU] via SUBCUTANEOUS
  Administered 2019-06-30: 2 [IU] via SUBCUTANEOUS
  Administered 2019-06-30 – 2019-07-05 (×11): 1 [IU] via SUBCUTANEOUS

## 2019-06-23 MED ORDER — DEXAMETHASONE SODIUM PHOSPHATE 10 MG/ML IJ SOLN
6.0000 mg | Freq: Every day | INTRAMUSCULAR | Status: DC
  Administered 2019-06-23 – 2019-06-30 (×8): 6 mg via INTRAVENOUS
  Filled 2019-06-23 (×9): qty 1

## 2019-06-23 MED ORDER — ATORVASTATIN CALCIUM 20 MG PO TABS
20.0000 mg | ORAL_TABLET | Freq: Every morning | ORAL | Status: DC
  Administered 2019-06-24 – 2019-07-07 (×7): 20 mg via ORAL
  Filled 2019-06-23 (×4): qty 1
  Filled 2019-06-23: qty 2
  Filled 2019-06-23 (×10): qty 1

## 2019-06-23 MED ORDER — IPRATROPIUM-ALBUTEROL 20-100 MCG/ACT IN AERS
INHALATION_SPRAY | RESPIRATORY_TRACT | Status: AC
  Administered 2019-06-23: 1 via RESPIRATORY_TRACT
  Filled 2019-06-23: qty 4

## 2019-06-23 MED ORDER — ASCORBIC ACID 500 MG PO TABS
500.0000 mg | ORAL_TABLET | Freq: Every day | ORAL | Status: DC
  Administered 2019-06-23 – 2019-06-26 (×4): 500 mg via ORAL
  Filled 2019-06-23 (×6): qty 1

## 2019-06-23 MED ORDER — SODIUM CHLORIDE 0.9 % IV SOLN
200.0000 mg | Freq: Once | INTRAVENOUS | Status: DC
  Filled 2019-06-23: qty 40

## 2019-06-23 MED ORDER — ACETAMINOPHEN 650 MG RE SUPP: 650.0000 mg | Freq: Four times a day (QID) | RECTAL | Status: DC | PRN

## 2019-06-23 MED ORDER — SODIUM CHLORIDE 0.9 % IV SOLN: INTRAVENOUS | Status: AC

## 2019-06-23 MED ORDER — ZINC SULFATE 220 (50 ZN) MG PO CAPS
220.0000 mg | ORAL_CAPSULE | Freq: Every day | ORAL | Status: DC
  Administered 2019-06-23 – 2019-07-07 (×8): 220 mg via ORAL
  Filled 2019-06-23 (×16): qty 1

## 2019-06-23 MED ORDER — SODIUM CHLORIDE 0.9 % IV SOLN: 100.0000 mg | Freq: Every day | INTRAVENOUS | Status: DC

## 2019-06-23 MED ORDER — PNEUMOCOCCAL VAC POLYVALENT 25 MCG/0.5ML IJ INJ
0.5000 mL | INJECTION | INTRAMUSCULAR | Status: AC
  Administered 2019-06-26: 0.5 mL via INTRAMUSCULAR
  Filled 2019-06-23: qty 0.5

## 2019-06-23 MED ORDER — SODIUM CHLORIDE 0.9 % IV SOLN
100.0000 mg | INTRAVENOUS | Status: AC
  Administered 2019-06-23 (×2): 100 mg via INTRAVENOUS
  Filled 2019-06-23 (×2): qty 20

## 2019-06-23 MED ORDER — ACETAMINOPHEN 325 MG PO TABS
650.0000 mg | ORAL_TABLET | Freq: Four times a day (QID) | ORAL | Status: DC | PRN
  Administered 2019-06-24: 650 mg via ORAL
  Filled 2019-06-23: qty 2

## 2019-06-23 MED ORDER — IPRATROPIUM-ALBUTEROL 20-100 MCG/ACT IN AERS
1.0000 | INHALATION_SPRAY | Freq: Four times a day (QID) | RESPIRATORY_TRACT | Status: DC
  Administered 2019-06-23 – 2019-06-25 (×5): 1 via RESPIRATORY_TRACT

## 2019-06-23 MED ORDER — LORATADINE 10 MG PO TABS
10.0000 mg | ORAL_TABLET | Freq: Every morning | ORAL | Status: DC
  Administered 2019-06-24 – 2019-06-26 (×3): 10 mg via ORAL
  Filled 2019-06-23 (×5): qty 1

## 2019-06-23 NOTE — Progress Notes (Signed)
Pharmacy Note - Remdesivir Dosing  O:  ALT:  13 CXR:  Mild hazy opacity of both lung bases, developing pneumonia is not Excluded. Requiring supplemental O2: O2 sat:93% on RA   A/P:  Patient meets criteria for remdesivir.  Begin remdesivir 200 mg IV x 1, followed by 100 mg IV daily x 4 days  Monitor ALT, clinical progress    Tama High, Pharm. D. Clinical Pharmacist 06/23/2019 3:58 PM

## 2019-06-23 NOTE — ED Triage Notes (Signed)
Pt c/o of rectal bleeding, weakness and sob x 7 days

## 2019-06-23 NOTE — H&P (Addendum)
History and Physical  Blake Collins OZD:[id number] DOB: [birth year] DOA: 06/23/2019   PCP: Joette Catching, MD   Patient coming from: Home  Chief Complaint: generalized weakness  HPI:  Blake Collins is a 85 y.o. male with medical history of diabetes mellitus type 2, hypertension, hyperlipidemia, CKD stage III, chronic low back pain, GERD, anxiety presenting with 1 to 2-week history of generalized weakness.  The patient has been complaining of some intermittent rectal bleeding for the better part of a week.  He denies any hemoptysis, hematemesis, melena, constipation, diarrhea, abdominal pain.  He has not had any fevers, chills, chest pain, cough, hemoptysis. The patient does complain of some intermittent shortness of breath over the past 2 to 3 days and worsening generalized weakness.  He denies any recent travels, worsening lower extremity edema or increasing abdominal girth or orthopnea.  As result, he presented for further evaluation.  He denies any falling or syncope.  He denies any new medications other than the fact that he received his first COVID-19 vaccination 3 days prior to this admission.  He denies any dysuria, hematuria, headache, neck pain, flank pain.  He quit smoking 20 years ago after approximately 60 pack-year history.  Notably, the patient states that his son with whom he lives has had some URI type symptoms.  Apparently his son went to get a COVID-19 test, but the results are unknown at this time. In the emergency department, the patient was afebrile hemodynamically stable with oxygen saturation 86% on room air.  He was placed on 2 L with oxygen saturation 95%.  BMP showed a serum creatinine 1.85 which is above his usual baseline.  LFTs were unremarkable.  WBC 8.3, hemoglobin 10.0, platelets 277,000.  Chest x-ray suggested possible hazy basilar opacities, cardiomegaly.  Assessment/Plan: Symptomatic anemia/rectal bleeding -Review of the medical record shows baseline hemoglobin  11-12 -GI consult -Clear liquid diet -Start PPI -Type and screen  Acute respiratory failure with hypoxia due COVID-19 pneumonia -Likely multifactorial including underlying COPD, pneumonia -Currently stable on 2 L nasal cannula -Chest x-ray does not show frank consolidation -Check procalcitonin -LDH -Ferritin -CRP -Ferritin -EKG shows sinus rhythm, IVCD, nonspecific ST ST wave changes -check SARS-CoV2 RT-PCR -check viral respiratory panel -start remdesivir -start dexamethasone  Acute on chronic renal failure--CKD stage IIIb -Baseline creatinine 1.2-1.5 -Secondary to volume depletion -Gentle fluid hydration -Holding lisinopril  Generalized weakness -Likely multifactorial including COVID 19, symptomatic anemia, acute on chronic renal failure -UA and urine culture -Serum B12 -folate -TSH -PT evaluation  Diabetes mellitus type 2 -Holding metformin and glipizide -NovoLog sliding scale  Essential hypertension -Holding lisinopril in the setting of soft blood pressure and acute on chronic renal failure  Hyperlipidemia -Continue statin       Past Medical History:  Diagnosis Date  . Arthritis   . Diabetes mellitus   . Hypertension    Past Surgical History:  Procedure Laterality Date  . ADENOIDECTOMY    . APPENDECTOMY    . CATARACT EXTRACTION    . COLONOSCOPY    . endo    . TONSILLECTOMY     Social History:  reports that he has quit smoking. He has never used smokeless tobacco. He reports that he does not drink alcohol or use drugs.   History reviewed. No pertinent family history.   No Known Allergies   Prior to Admission medications   Medication Sig Start Date End Date Taking? Authorizing Provider  acetaminophen (TYLENOL) 650 MG CR tablet Take 1,300  mg by mouth 2 (two) times daily as needed for pain.     [provider]  aspirin EC 81 MG tablet Take 81 mg by mouth daily.      [provider]  atorvastatin (LIPITOR) 20 MG tablet Take  20 mg by mouth every morning.    [provider]  clonazePAM (KLONOPIN) 1 MG tablet Take 1 mg by mouth at bedtime.      [provider]  cyclobenzaprine (FLEXERIL) 10 MG tablet Take 10 mg by mouth at bedtime as needed for muscle spasms.    [provider]  DHA-EPA-Flaxseed Oil-Vitamin E (THERA TEARS) CAPS Take 2 tablets by mouth at bedtime.    [provider]  glipiZIDE (GLUCOTROL XL) 2.5 MG 24 hr tablet Take 2.5 mg by mouth every morning.    [provider]  HYDROcodone-acetaminophen (NORCO/VICODIN) 5-325 MG per tablet Take 1 tablet by mouth 2 (two) times daily as needed for moderate pain or severe pain.    [provider]  lisinopril (PRINIVIL,ZESTRIL) 20 MG tablet Take 20 mg by mouth every morning.     [provider]  loratadine (CLARITIN) 10 MG tablet Take 10 mg by mouth every morning.     [provider]  metFORMIN (GLUCOPHAGE) 1000 MG tablet Take 1,000 mg by mouth 2 (two) times daily with a meal.      [provider]  methocarbamol (ROBAXIN) 500 MG tablet Take 500 mg by mouth at bedtime.    [provider]    Review of Systems:  Constitutional:  No weight loss, night sweats, Fevers, chills Head&Eyes: No headache.  No vision loss.  No eye pain or scotoma ENT:  No Difficulty swallowing,Tooth/dental problems,Sore throat,  No ear ache, post nasal drip,  Cardio-vascular:  No chest pain, Orthopnea, PND, swelling in lower extremities,  dizziness, palpitations  GI:  No  abdominal pain, diarrhea, loss of appetite, hematochezia, melena, heartburn, indigestion, Resp:  No coughing up of blood .No wheezing.No chest wall deformity  Skin:  no rash or lesions.  GU:  no dysuria, change in color of urine, no urgency or frequency. No flank pain.  Musculoskeletal:  No joint pain or swelling. No decreased range of motion. No back pain.  Psych:  No change in mood or affect. No depression or  anxiety. Neurologic: No headache, no dysesthesia, no focal weakness, no vision loss. No syncope  Physical Exam: Vitals:   06/23/19 1230 06/23/19 1245 06/23/19 1300 06/23/19 1330  BP: 108/67  97/69 109/61  Pulse: 81 87 80 81  Resp: (!) 26 (!) 25 (!) 22 (!) 21  Temp:      TempSrc:      SpO2: 95% 93% 93%   Weight:      Height:       General:  A&O x 3, NAD, nontoxic, pleasant/cooperative Head/Eye: No conjunctival hemorrhage, no icterus, [place]/AT, No nystagmus ENT:  No icterus,  No thrush, good dentition, no pharyngeal exudate Neck:  No masses, no lymphadenpathy, no bruits CV:  RRR, no rub, no gallop, no S3 Lung:  Bibasilar crackles, L>R. No wheeze Abdomen: soft/NT, +BS, nondistended, no peritoneal signs Ext: No cyanosis, No rashes, No petechiae, No lymphangitis, No edema Neuro: CNII-XII intact, strength 4/5 in bilateral upper and lower extremities, no dysmetria  Labs on Admission:  Basic Metabolic Panel: Recent Labs  Lab 06/23/19 1153  NA 140  K 4.1  CL 108  CO2 20*  GLUCOSE 134*  BUN 37*  CREATININE 1.85*  CALCIUM 8.5*   Liver Function Tests: Recent Labs  Lab 06/23/19 1153  AST 28  ALT 13  ALKPHOS 76  BILITOT 0.7  PROT 7.3  ALBUMIN 2.9*   No results for input(s): LIPASE, AMYLASE in the last 168 hours. No results for input(s): AMMONIA in the last 168 hours. CBC: Recent Labs  Lab 06/23/19 1153  WBC 8.3  NEUTROABS 6.7  HGB 10.0*  HCT 32.5*  MCV 84.2  PLT 277   Coagulation Profile: No results for input(s): INR, PROTIME in the last 168 hours. Cardiac Enzymes: No results for input(s): CKTOTAL, CKMB, CKMBINDEX, TROPONINI in the last 168 hours. BNP: Invalid input(s): POCBNP CBG: No results for input(s): GLUCAP in the last 168 hours. Urine analysis:    Component Value Date/Time   COLORURINE YELLOW 02/17/2011 1334   APPEARANCEUR CLEAR 02/17/2011 1334   LABSPEC 1.015 02/17/2011 1334   PHURINE 6.0 02/17/2011 1334   GLUCOSEU NEGATIVE 02/17/2011 1334    HGBUR TRACE (A) 02/17/2011 1334   BILIRUBINUR NEGATIVE 02/17/2011 1334   KETONESUR NEGATIVE 02/17/2011 1334   PROTEINUR NEGATIVE 02/17/2011 1334   UROBILINOGEN 0.2 02/17/2011 1334   NITRITE NEGATIVE 02/17/2011 1334   LEUKOCYTESUR TRACE (A) 02/17/2011 1334   Sepsis Labs: @LABRCNTIP (procalcitonin:4,lacticidven:4) )No results found for this or any previous visit (from the past 240 hour(s)).   Radiological Exams on Admission: DG Chest Port 1 View  Result Date: 06/23/2019 CLINICAL DATA:  Shortness of breath for 7 days EXAM: PORTABLE CHEST 1 VIEW COMPARISON:  None. FINDINGS: Mediastinal contour is normal. Heart size is enlarged. There is mild hazy opacity of both lung bases. There is no pulmonary edema. No acute abnormalities identified in the visualized bony structures. IMPRESSION: Mild hazy opacity of both lung bases, developing pneumonia is not excluded. Electronically Signed   By: 06/25/2019 M.D.   On: 06/23/2019 12:37    EKG: Independently reviewed. Sinus, IVCD, nonspecific STT changes    Time spent:60 minutes Code Status:   FULL Family Communication:  Daughter updated 3/27 at bedside Disposition Plan: expect 2-3 day hospitalization Consults called:GI--[facility] DVT Prophylaxis:   SCDs  4/27, DO  [facility] Pager [phone]  If 7PM-7AM, please contact night-coverage [email or web address] Password [facility] 06/23/2019, 2:38 PM

## 2019-06-23 NOTE — ED Provider Notes (Signed)
[facility] EMERGENCY DEPARTMENT Provider Note   CSN: [id number] Arrival date & time: 06/23/19  1126     History Chief Complaint  Patient presents with  . Rectal Bleeding  . Weakness    Blake Collins is a 85 y.o. male.  HPI   85 y/o male -presents from home with a complaint of weakness, rectal bleeding and shortness of breath.  He states that the weakness has been progressive over 2 weeks, the shortness of breath is been over the last several days and correlates with his son's illness who lives at home with him who has also been short of breath but had negative Covid testing.  The patient denies any coughing or fevers or chills, he denies any swelling of the legs, he is not sleeping on any extra pillows at night but does become very short of breath when he walks and feels generally more short of breath recently.  He does not take any anticoagulants other than a baby aspirin, however he has noticed some bright red blood and dark red blood in his stools from time to time increasing in frequency.  He denies abdominal discomfort.  He has not been able to eat or drink anything in the last few days stating that he is nauseated and vomiting frequently.  Symptoms are getting worse, no associated known fevers or difficulty urinating  Past Medical History:  Diagnosis Date  . Arthritis   . Diabetes mellitus   . Hypertension     There are no problems to display for this patient.   Past Surgical History:  Procedure Laterality Date  . ADENOIDECTOMY    . APPENDECTOMY    . CATARACT EXTRACTION    . COLONOSCOPY    . endo    . TONSILLECTOMY         History reviewed. No pertinent family history.  Social History   Tobacco Use  . Smoking status: Former Research scientist (life sciences)  . Smokeless tobacco: Never Used  Substance Use Topics  . Alcohol use: No  . Drug use: No    Home Medications Prior to Admission medications   Medication Sig Start Date End Date Taking? Authorizing Provider  acetaminophen  (TYLENOL) 650 MG CR tablet Take 1,300 mg by mouth 2 (two) times daily as needed for pain.     [provider]  aspirin EC 81 MG tablet Take 81 mg by mouth daily.      [provider]  atorvastatin (LIPITOR) 20 MG tablet Take 20 mg by mouth every morning.    [provider]  clonazePAM (KLONOPIN) 1 MG tablet Take 1 mg by mouth at bedtime.      [provider]  cyclobenzaprine (FLEXERIL) 10 MG tablet Take 10 mg by mouth at bedtime as needed for muscle spasms.    [provider]  DHA-EPA-Flaxseed Oil-Vitamin E (THERA TEARS) CAPS Take 2 tablets by mouth at bedtime.    [provider]  glipiZIDE (GLUCOTROL XL) 2.5 MG 24 hr tablet Take 2.5 mg by mouth every morning.    [provider]  HYDROcodone-acetaminophen (NORCO/VICODIN) 5-325 MG per tablet Take 1 tablet by mouth 2 (two) times daily as needed for moderate pain or severe pain.    [provider]  lisinopril (PRINIVIL,ZESTRIL) 20 MG tablet Take 20 mg by mouth every morning.     [provider]  loratadine (CLARITIN) 10 MG tablet Take 10 mg by mouth every morning.     [provider]  metFORMIN (GLUCOPHAGE) 1000 MG  tablet Take 1,000 mg by mouth 2 (two) times daily with a meal.      [provider]  methocarbamol (ROBAXIN) 500 MG tablet Take 500 mg by mouth at bedtime.    [provider]    Allergies    Patient has no known allergies.  Review of Systems   Review of Systems  Physical Exam Updated Vital Signs BP 124/71   Pulse 94   Temp 98.2 F (36.8 C) (Oral)   Resp (!) 22   Ht 1.778 m (5\' 10" )   Wt 104.3 kg   SpO2 94%   BMI 33.00 kg/m   Physical Exam Vitals and nursing note reviewed.  Constitutional:      General: He is in acute distress ( Acute respiratory distress).     Appearance: He is well-developed. He is not ill-appearing.  HENT:     Head: Normocephalic and atraumatic.     Mouth/Throat:     Pharynx: No oropharyngeal  exudate.  Eyes:     General: No scleral icterus.       Right eye: No discharge.        Left eye: No discharge.     Conjunctiva/sclera: Conjunctivae normal.     Pupils: Pupils are equal, round, and reactive to light.  Neck:     Thyroid: No thyromegaly.     Vascular: No JVD.  Cardiovascular:     Rate and Rhythm: Normal rate and regular rhythm.     Heart sounds: Normal heart sounds. No murmur. No friction rub. No gallop.   Pulmonary:     Effort: Respiratory distress present.     Breath sounds: Normal breath sounds. No wheezing or rales.     Comments: Tachypneic, clear lung sounds, speaks in shortened sentences Abdominal:     General: Bowel sounds are normal. There is no distension.     Palpations: Abdomen is soft. There is no mass.     Tenderness: There is no abdominal tenderness.     Comments: Nontender abdomen  Genitourinary:    Comments: Chaperone present for exam, normal-appearing external anal structures, no visible bleeding hemorrhoids or fissures, nontender, no masses, bloody mucoid stool in the rectal vault Musculoskeletal:        General: No tenderness. Normal range of motion.     Cervical back: Normal range of motion and neck supple.  Lymphadenopathy:     Cervical: No cervical adenopathy.  Skin:    General: Skin is warm and dry.     Findings: No erythema or rash.  Neurological:     Mental Status: He is alert.     Coordination: Coordination normal.  Psychiatric:        Behavior: Behavior normal.     ED Results / Procedures / Treatments   Labs (all labs ordered are listed, but only abnormal results are displayed) Labs Reviewed  CBC WITH DIFFERENTIAL/PLATELET - Abnormal; Notable for the following components:      Result Value   RBC 3.86 (*)    Hemoglobin 10.0 (*)    HCT 32.5 (*)    MCH 25.9 (*)    RDW 15.6 (*)    Abs Immature Granulocytes 0.10 (*)    All other components within normal limits  COMPREHENSIVE METABOLIC PANEL - Abnormal; Notable for the following  components:   CO2 20 (*)    Glucose, Bld 134 (*)    BUN 37 (*)    Creatinine, Ser 1.85 (*)    Calcium 8.5 (*)  Albumin 2.9 (*)    GFR calc non Af Amer 33 (*)    GFR calc Af Amer 38 (*)    All other components within normal limits  POC OCCULT BLOOD, ED - Abnormal; Notable for the following components:   Fecal Occult Bld POSITIVE (*)    All other components within normal limits  RESPIRATORY PANEL BY RT PCR (FLU A&B, COVID)  URINE CULTURE  LACTIC ACID, PLASMA  URINALYSIS, ROUTINE W REFLEX MICROSCOPIC  POC SARS CORONAVIRUS 2 AG -  ED  TYPE AND SCREEN  TROPONIN I (HIGH SENSITIVITY)    EKG EKG Interpretation  Date/Time:  Saturday June 23 2019 11:58:08 EDT Ventricular Rate:  90 PR Interval:    QRS Duration: 140 QT Interval:  403 QTC Calculation: 494 R Axis:   -34 Text Interpretation: Sinus rhythm Borderline short PR interval Nonspecific intraventricular conduction delay Probable anteroseptal infarct, old Minimal ST depression, lateral leads since last tracing no significant change Confirmed by Eber Hong ([id number]) on 06/23/2019 12:00:54 PM   Radiology DG Chest Port 1 View  Result Date: 06/23/2019 CLINICAL DATA:  Shortness of breath for 7 days EXAM: PORTABLE CHEST 1 VIEW COMPARISON:  None. FINDINGS: Mediastinal contour is normal. Heart size is enlarged. There is mild hazy opacity of both lung bases. There is no pulmonary edema. No acute abnormalities identified in the visualized bony structures. IMPRESSION: Mild hazy opacity of both lung bases, developing pneumonia is not excluded. Electronically Signed   By: Sherian Rein M.D.   On: 06/23/2019 12:37    Procedures .Critical Care Performed by: Eber Hong, MD Authorized by: Eber Hong, MD   Critical care provider statement:    Critical care time (minutes):  35   Critical care time was exclusive of:  Separately billable procedures and treating other patients and teaching time   Critical care was necessary to treat or  prevent imminent or life-threatening deterioration of the following conditions:  Respiratory failure   Critical care was time spent personally by me on the following activities:  Blood draw for specimens, development of treatment plan with patient or surrogate, discussions with consultants, evaluation of patient's response to treatment, examination of patient, obtaining history from patient or surrogate, ordering and performing treatments and interventions, ordering and review of laboratory studies, ordering and review of radiographic studies, pulse oximetry, re-evaluation of patient's condition and review of old charts Comments:         (including critical care time)  Medications Ordered in ED Medications - No data to display  ED Course  I have reviewed the triage vital signs and the nursing notes.  Pertinent labs & imaging results that were available during my care of the patient were reviewed by me and considered in my medical decision making (see chart for details).    MDM Rules/Calculators/A&P                      Hemoccult is immediately bedside positive, it was also visually positive.  The patient has a nontender abdomen, he has had some rectal bleeding but is also had some increasing shortness of breath which correlates with the family members respiratory illness.  He will need testing for Covid, pneumonia, gastrointestinal bleeding and potential anemia causing his weakness.  We will also obtain an EKG and troponin and place him on a cardiac monitor.  The patient is agreeable to the plan.  His oxygen level is somewhere between 84 and 86% on room air, he was placed on  2 L and I anticipate admission for acute hypoxic respiratory failure.   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  D/w Dr. Jena Gauss  who will see pt in consultation  Paged hospitalist for admission  CXR shows bilateral infiltrates (bases), not well visualized by me.    UA pending to look for UTI - potentially predisposing to AKI   Final Clinical Impression(s) / ED Diagnoses Final diagnoses:  Lower GI bleeding  Anemia, unspecified type  Acute respiratory failure with hypoxia (HCC)  Acute renal failure, unspecified acute renal failure type (HCC)      Eber Hong, MD 06/23/19 1352

## 2019-06-24 ENCOUNTER — Other Ambulatory Visit: Payer: Self-pay | Admitting: Internal Medicine

## 2019-06-24 DIAGNOSIS — U071 COVID-19: Principal | ICD-10-CM

## 2019-06-24 DIAGNOSIS — K922 Gastrointestinal hemorrhage, unspecified: Secondary | ICD-10-CM

## 2019-06-24 DIAGNOSIS — D649 Anemia, unspecified: Secondary | ICD-10-CM

## 2019-06-24 DIAGNOSIS — J9600 Acute respiratory failure, unspecified whether with hypoxia or hypercapnia: Secondary | ICD-10-CM

## 2019-06-24 LAB — GLUCOSE, CAPILLARY
Glucose-Capillary: 140 mg/dL — ABNORMAL HIGH (ref 70–99)
Glucose-Capillary: 182 mg/dL — ABNORMAL HIGH (ref 70–99)
Glucose-Capillary: 189 mg/dL — ABNORMAL HIGH (ref 70–99)
Glucose-Capillary: 162 mg/dL — ABNORMAL HIGH (ref 70–99)
Glucose-Capillary: 174 mg/dL — ABNORMAL HIGH (ref 70–99)

## 2019-06-24 LAB — COMPREHENSIVE METABOLIC PANEL
ALT: 15 U/L (ref 0–44)
AST: 32 U/L (ref 15–41)
Albumin: 2.7 g/dL — ABNORMAL LOW (ref 3.5–5.0)
Alkaline Phosphatase: 77 U/L (ref 38–126)
Anion gap: 12 (ref 5–15)
BUN: 33 mg/dL — ABNORMAL HIGH (ref 8–23)
CO2: 21 mmol/L — ABNORMAL LOW (ref 22–32)
Calcium: 8.9 mg/dL (ref 8.9–10.3)
Chloride: 108 mmol/L (ref 98–111)
Creatinine, Ser: 1.49 mg/dL — ABNORMAL HIGH (ref 0.61–1.24)
GFR calc Af Amer: 50 mL/min — ABNORMAL LOW (ref >60)
GFR calc non Af Amer: 43 mL/min — ABNORMAL LOW (ref >60)
Glucose, Bld: 163 mg/dL — ABNORMAL HIGH (ref 70–99)
Potassium: 4.2 mmol/L (ref 3.5–5.1)
Sodium: 141 mmol/L (ref 135–145)
Total Bilirubin: 0.6 mg/dL (ref 0.3–1.2)
Total Protein: 7.3 g/dL (ref 6.5–8.1)

## 2019-06-24 LAB — CBC WITH DIFFERENTIAL/PLATELET
Abs Immature Granulocytes: 0.07 10*3/uL (ref 0.00–0.07)
Basophils Absolute: 0 10*3/uL (ref 0.0–0.1)
Basophils Relative: 0 %
Eosinophils Absolute: 0 10*3/uL (ref 0.0–0.5)
Eosinophils Relative: 0 %
HCT: 34.4 % — ABNORMAL LOW (ref 39.0–52.0)
Hemoglobin: 10.4 g/dL — ABNORMAL LOW (ref 13.0–17.0)
Immature Granulocytes: 1 %
Lymphocytes Relative: 8 %
Lymphs Abs: 0.5 10*3/uL — ABNORMAL LOW (ref 0.7–4.0)
MCH: 25.2 pg — ABNORMAL LOW (ref 26.0–34.0)
MCHC: 30.2 g/dL (ref 30.0–36.0)
MCV: 83.5 fL (ref 80.0–100.0)
Monocytes Absolute: 0.2 10*3/uL (ref 0.1–1.0)
Monocytes Relative: 3 %
Neutro Abs: 5.4 10*3/uL (ref 1.7–7.7)
Neutrophils Relative %: 88 %
Platelets: 343 10*3/uL (ref 150–400)
RBC: 4.12 MIL/uL — ABNORMAL LOW (ref 4.22–5.81)
RDW: 15.5 % (ref 11.5–15.5)
WBC: 6.1 10*3/uL (ref 4.0–10.5)
nRBC: 0 % (ref 0.0–0.2)

## 2019-06-24 LAB — D-DIMER, QUANTITATIVE: D-Dimer, Quant: 3.12 ug/mL-FEU — ABNORMAL HIGH (ref 0.00–0.50)

## 2019-06-24 LAB — PROCALCITONIN: Procalcitonin: 0.15 ng/mL

## 2019-06-24 LAB — C-REACTIVE PROTEIN: CRP: 20.2 mg/dL — ABNORMAL HIGH (ref <1.0)

## 2019-06-24 LAB — FOLATE: Folate: 14.3 ng/mL (ref >5.9)

## 2019-06-24 LAB — PHOSPHORUS: Phosphorus: 2.2 mg/dL — ABNORMAL LOW (ref 2.5–4.6)

## 2019-06-24 LAB — MAGNESIUM: Magnesium: 1.6 mg/dL — ABNORMAL LOW (ref 1.7–2.4)

## 2019-06-24 LAB — FERRITIN: Ferritin: 115 ng/mL (ref 24–336)

## 2019-06-24 MED ORDER — MAGNESIUM SULFATE 2 GM/50ML IV SOLN
2.0000 g | Freq: Once | INTRAVENOUS | Status: AC
  Administered 2019-06-24: 2 g via INTRAVENOUS
  Filled 2019-06-24: qty 50

## 2019-06-24 MED ORDER — SODIUM CHLORIDE 0.9 % IV SOLN
1.0000 g | INTRAVENOUS | Status: AC
  Administered 2019-06-24 – 2019-06-26 (×3): 1 g via INTRAVENOUS
  Filled 2019-06-24 (×3): qty 10

## 2019-06-24 MED ORDER — K PHOS MONO-SOD PHOS DI & MONO 155-852-130 MG PO TABS
500.0000 mg | ORAL_TABLET | Freq: Three times a day (TID) | ORAL | Status: DC
  Administered 2019-06-24 – 2019-06-26 (×7): 500 mg via ORAL
  Filled 2019-06-24 (×9): qty 2

## 2019-06-24 MED ORDER — SODIUM CHLORIDE 0.9 % IV SOLN: INTRAVENOUS | Status: AC

## 2019-06-24 NOTE — Progress Notes (Signed)
Chair alarm sounding, arrived to room to find pt up out of chair stumbling towards bathroom. IV tubing stretched tight, O2 off. Pt states, "I gotta go to the bathroom.". Assisted pt back to chair, gave pt urinal that was sitting on bedside table besides him. Pt then assisted to stand and was able to urinate. Pt back into chair, O2 nasal cannula reinserted in nares.  Pt SOB with exertion, SaO2 89% without O2 on. Once O2 on and pt rested 5 minutes, SaO2 back up to 92-93%. Resp 21/min.  IV intact and re-taped for security. Infusing without difficulty.  Reminded patient again that he must call for assistance before getting up due to fall risk with multiple tubings and his unsteady gait.  For pt safety, pt moved to room 341 negative pressure, which has window for viewing pt, is at nursing station, and is quieter (which will facilitate easier communication with pt due to his hearing loss).  Notified family of pt room change and rationale. Son Shon Hale stated understanding.  Pt oriented to room, call bell and safety measures. Pt requested to stay in bed, bed alarm on. Call bell in patient's hand, had pt to give return demonstration of how to call for staff.

## 2019-06-24 NOTE — Progress Notes (Signed)
Bed alarm sounding, arrived to room to find pt getting OOB. Pt states, "I gotta pee." Assisted pt to use urinal, voided clear yellow urine. Pt assisted back to bed. SaO2 93%, resp 21, denies c/o.  Asked pt why he did not call for help before getting OOB and pt stated, "I didn't know I had to." Reminded pt again that he must use call bell for safety. Instructed on use of call bell, had pt demonstrate how he would use the call bell. Call bell placed on pt's lap in bed.  Pt is A&O x4, answers orientation questions appropriately but still has periods of confusion and impulsiveness. Charge nurse aware of pt's behavior. Have consulted Admin Supervisor on possibility of getting tele sitter for patient.

## 2019-06-24 NOTE — Consult Note (Signed)
Referring Provider: No ref. provider found Primary Care Physician:  Dione Housekeeper, MD Primary Gastroenterologist:  Dr. Laural Golden  Reason for Consultation: Blood per rectum  HPI: 85 year old gentleman presented to the emergency department yesterday with 2-week history of progressive fatigue and shortness of breath.  Patient saturating in the mid 80s on room air yesterday.  Chest x-ray revealed hazy infiltrates.  COVID-19 assay positive.  He has been admitted for further treatment of Covid pneumonia. He notes over the past 2 weeks he is passed a small amount of fresh blood with what he otherwise  describes as normal bowel movements; no black stool.  No abdominal pain, no upper GI tract symptoms such as nausea, vomiting, reflux, odynophagia or dysphagia symptoms.  He states he continues to have a good appetite. According to Dr. [name] Heck he had mucus-like bloody stool on DRE in the ED yesterday. Hemoglobin 10.0 on admission; today 10.4.  Ferritin 97. He has not passed any blood since admission.  BNP 107, CRP 20.8.  Patient has been started on Decadron, remdesivir, zinc, vitamin C, Rocephin and Protonix 40 mg orally daily. GI history is significant for short segment Barrett's esophagus diagnosed in 2012 at EGD.  No chronic PPI. 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    Past Medical History:  Diagnosis Date  . Arthritis   . Diabetes mellitus   . Hypertension   Chronic kidney disease. Pernicious anemia.  Past Surgical History:  Procedure Laterality Date  . ADENOIDECTOMY    . APPENDECTOMY    . CATARACT EXTRACTION    . COLONOSCOPY    . endo    . TONSILLECTOMY      Prior to Admission medications   Medication Sig Start Date End Date Taking? Authorizing Provider  acetaminophen (TYLENOL) 650 MG CR tablet Take 1,300 mg by mouth 2 (two) times daily as needed for pain.      [provider]  aspirin EC 81 MG tablet Take 81 mg by mouth daily.      [provider]  atorvastatin (LIPITOR) 20 MG tablet Take 20 mg by mouth every morning.    [provider]  clonazePAM (KLONOPIN) 1 MG tablet Take 1 mg by mouth at bedtime.      [provider]  cyclobenzaprine (FLEXERIL) 10 MG tablet Take 10 mg by mouth at bedtime as needed for muscle spasms.    [provider]  DHA-EPA-Flaxseed Oil-Vitamin E (THERA TEARS) CAPS Take 2 tablets by mouth at bedtime.    [provider]  glipiZIDE (GLUCOTROL XL) 2.5 MG 24 hr tablet Take 2.5 mg by mouth every morning.    [provider]  HYDROcodone-acetaminophen (NORCO/VICODIN) 5-325 MG per tablet Take 1 tablet by mouth 2 (two) times daily as needed for moderate pain or severe pain.    [provider]  lisinopril (PRINIVIL,ZESTRIL) 20 MG tablet Take 20 mg by mouth every morning.     [provider]  loratadine (CLARITIN) 10 MG tablet Take 10 mg by mouth every morning.     [provider]  metFORMIN (GLUCOPHAGE) 1000 MG tablet Take 1,000 mg by mouth 2 (two) times daily with a meal.      [provider]  methocarbamol (ROBAXIN) 500 MG tablet Take 500 mg by mouth at bedtime.    [provider]    Current Facility-Administered Medications  Medication Dose Route Frequency Provider Last Rate Last  Admin  . 0.9 %  sodium chloride infusion   Intravenous Continuous Catarina Hartshorn, MD 75 mL/hr at 06/24/19 0838 New Bag at 06/24/19 3419  . acetaminophen (TYLENOL) tablet 650 mg  650 mg Oral Q6H PRN Tat, David, MD       Or  . acetaminophen (TYLENOL) suppository 650 mg  650 mg Rectal Q6H PRN Tat, Onalee Hua, MD      . ascorbic acid (VITAMIN C) tablet 500 mg  500 mg Oral Daily Tat, David, MD   500 mg at 06/24/19 1024  . atorvastatin (LIPITOR) tablet 20 mg  20 mg Oral q morning - 10a Catarina Hartshorn, MD   20 mg at 06/24/19 1024  . cefTRIAXone (ROCEPHIN) 1 g in sodium  chloride 0.9 % 100 mL IVPB  1 g Intravenous Q24H Catarina Hartshorn, MD 200 mL/hr at 06/24/19 0842 1 g at 06/24/19 0842  . dexamethasone (DECADRON) injection 6 mg  6 mg Intravenous Daily Tat, David, MD   6 mg at 06/24/19 1024  . insulin aspart (novoLOG) injection 0-9 Units  0-9 Units Subcutaneous TID WC Catarina Hartshorn, MD   1 Units at 06/24/19 1018  . Ipratropium-Albuterol (COMBIVENT) respimat 1 puff  1 puff Inhalation Q6H Catarina Hartshorn, MD   1 puff at 06/24/19 0736  . loratadine (CLARITIN) tablet 10 mg  10 mg Oral q morning - 10a Catarina Hartshorn, MD   10 mg at 06/24/19 1024  . ondansetron (ZOFRAN) tablet 4 mg  4 mg Oral Q6H PRN Tat, David, MD       Or  . ondansetron (ZOFRAN) injection 4 mg  4 mg Intravenous Q6H PRN Tat, David, MD      . pantoprazole (PROTONIX) EC tablet 40 mg  40 mg Oral Daily Tat, David, MD   40 mg at 06/24/19 1023  . [START ON 06/26/2019] pneumococcal 23 valent vaccine (PNEUMOVAX-23) injection 0.5 mL  0.5 mL Intramuscular Tomorrow-1000 Tat, David, MD      . remdesivir 100 mg in sodium chloride 0.9 % 100 mL IVPB  100 mg Intravenous Daily Tat, David, MD 200 mL/hr at 06/24/19 1023 100 mg at 06/24/19 1023  . zinc sulfate capsule 220 mg  220 mg Oral Daily Tat, David, MD   220 mg at 06/24/19 1023    Allergies as of 06/23/2019  . (No Known Allergies)    History reviewed. No pertinent family history.  Social History   Socioeconomic History  . Marital status: Married    Spouse name: Not on file  . Number of children: Not on file  . Years of education: Not on file  . Highest education level: Not on file  Occupational History  . Not on file  Tobacco Use  . Smoking status: Former Games developer  . Smokeless tobacco: Never Used  Substance and Sexual Activity  . Alcohol use: No  . Drug use: No  . Sexual activity: Not on file  Other Topics Concern  . Not on file  Social History Narrative  . Not on file   Social Determinants of Health   Financial Resource Strain:   . Difficulty of Paying Living  Expenses:   Food Insecurity:   . Worried About Programme researcher, broadcasting/film/video in the Last Year:   . Barista in the Last Year:   Transportation Needs:   . Freight forwarder (Medical):   [name] Lack of Transportation (Non-Medical):   Physical Activity:   . Days of Exercise per Week:   . Minutes of Exercise  per Session:   Stress:   . Feeling of Stress :   Social Connections:   . Frequency of Communication with Friends and Family:   . Frequency of Social Gatherings with Friends and Family:   . Attends Religious Services:   . Active Member of Clubs or Organizations:   . Attends Banker Meetings:   [name] Marital Status:   Intimate Partner Violence:   . Fear of Current or Ex-Partner:   . Emotionally Abused:   [name] Physically Abused:   . Sexually Abused:     Review of Systems: As in history of present illness. Physical Exam: Vital signs in last 24 hours: Temp:  [97.8 F (36.6 C)-98.9 F (37.2 C)] 97.8 F (36.6 C) (03/28 0431) Pulse Rate:  [52-94] 90 (03/28 0431) Resp:  [17-27] 17 (03/28 0431) BP: (95-169)/(61-145) 117/75 (03/28 0431) SpO2:  [88 %-95 %] 94 % (03/28 0736) Weight:  [104.3 kg] 104.3 kg (03/27 1151) Last BM Date: 06/24/19 General:   He is sitting in bedside chair.  He is alert conversant and appears comfortable and in no respiratory distress.  He is extremely hard of hearing.  Accompanied by nursing staff. Lungs:  Clear throughout to auscultation.  Bilateral wheezing lower and midlung fields Heart:  Regular rate and rhythm; no murmurs, clicks, rubs,  or gallops. Abdomen: Obese.  Somewhat "doughy" abdominal wall.  Soft and nontender without appreciable mass organomegaly small infraumbilical scab present Extremities:  Without clubbing or edema.  Intake/Output from previous day: 03/27 0701 - 03/28 0700 In: 998.6 [P.O.:240; I.V.:758.6] Out: 825 [Urine:825] Intake/Output this shift: Total I/O In: 360 [P.O.:360] Out: 200 [Urine:200]  Lab Results: Recent Labs     06/23/19 1153 06/24/19 0615  WBC 8.3 6.1  HGB 10.0* 10.4*  HCT 32.5* 34.4*  PLT 277 343   BMET Recent Labs    06/23/19 1153 06/24/19 0615  NA 140 141  K 4.1 4.2  CL 108 108  CO2 20* 21*  GLUCOSE 134* 163*  BUN 37* 33*  CREATININE 1.85* 1.49*  CALCIUM 8.5* 8.9   LFT Recent Labs    06/24/19 0615  PROT 7.3  ALBUMIN 2.7*  AST 32  ALT 15  ALKPHOS 77  BILITOT 0.6   Impression: Blake Collins is a pleasant 85 year old gentleman admitted to the hospital with COVID 19 infection with likely pulmonary involvement rather than heart failure.  Intermittent rectal bleeding over the past 2 weeks.  Otherwise, he is devoid of any GI symptoms.    He has a history of a known cecal AVM and short segment Barrett's esophagus.  Has not been on any acid suppression therapy as best as can be determined.  Etiology of bleeding not well-defined at this time.  Could be directly or indirectly related to COVID-19 infection.  Multiple other possibilities exist.  Recommendations: Agree with focusing treatment on COVID-19 and providing PPI empirically.  Follow H&H. As long as he remains stable from a GI standpoint, would defer GI evaluation until a later date.  He should have an eventual colonoscopy as well as a possible EGD once he is over acute illness. Dr. Karilyn Cota will see 3/30.            Notice:  This dictation was prepared with Dragon dictation along with smaller phrase technology. Any transcriptional errors that result from this process are unintentional and may not be corrected upon review.

## 2019-06-24 NOTE — Progress Notes (Signed)
Dr. Benard Rink in to see and evaluate patient.

## 2019-06-24 NOTE — Plan of Care (Signed)
  Problem: Acute Rehab PT Goals(only PT should resolve) Goal: Pt Will Go Supine/Side To Sit Outcome: Progressing Flowsheets (Taken 06/24/2019 1016) Pt will go Supine/Side to Sit: . Independently . with modified independence Goal: Patient Will Transfer Sit To/From Stand Outcome: Progressing Flowsheets (Taken 06/24/2019 1016) Patient will transfer sit to/from stand: with modified independence Goal: Pt Will Transfer Bed To Chair/Chair To Bed Outcome: Progressing Flowsheets (Taken 06/24/2019 1016) Pt will Transfer Bed to Chair/Chair to Bed: with modified independence Goal: Pt Will Ambulate Outcome: Progressing Flowsheets (Taken 06/24/2019 1016) Pt will Ambulate: . > 125 feet . with modified independence   10:16 AM, 06/24/19 Ocie Bob, MPT Physical Therapist with [facility] [phone] office [phone] mobile phone

## 2019-06-24 NOTE — Progress Notes (Signed)
Bed alarm sounding, arrived to room to find pt had climbed over lower side rail on left side of bed, was standing at foot of bed. IV tubing pulled tight, tape pulled off arm, IV still intact. Pt has pulled O2 off again.   Assited pt back to bed, pt states, "I gotta go to the bathroom again." Pt stated needed to urinate. Reminded pt that he needed to call for assistance before getting OOB, Pt states, "Oh really? I didn't know that." Assisted pt to use urinal, voided clear yellow urine. Pt back to bed, bed alarm on. O2 nasal cannula replaced, SaO2 91% initially, up to 93% after rest. Pt again SOB with exertion but recovered well.  Pt instructed on use of call bell again, gave good demonstration return and verbal return. TV turned to old movie and pt stated thanks.

## 2019-06-24 NOTE — Evaluation (Signed)
Physical Therapy Evaluation Patient Details Name: Blake Collins MRN: [id number] DOB: 1934-12-28 Today's Date: 06/24/2019   History of Present Illness  Blake Collins is a 85 y.o. male with medical history of diabetes mellitus type 2, hypertension, hyperlipidemia, CKD stage III, chronic low back pain, GERD, anxiety presenting with 1 to 2-week history of generalized weakness.  The patient has been complaining of some intermittent rectal bleeding for the better part of a week.  He denies any hemoptysis, hematemesis, melena, constipation, diarrhea, abdominal pain.  He has not had any fevers, chills, chest pain, cough, hemoptysis.The patient does complain of some intermittent shortness of breath over the past 2 to 3 days and worsening generalized weakness.  He denies any recent travels, worsening lower extremity edema or increasing abdominal girth or orthopnea.  As result, he presented for further evaluation.  He denies any falling or syncope.  He denies any new medications other than the fact that he received his first COVID-19 vaccination 3 days prior to this admission.  He denies any dysuria, hematuria, headache, neck pain, flank pain.  He quit smoking 20 years ago after approximately 60 pack-year history.  Notably, the patient states that his son with whom he lives has had some URI type symptoms.  Apparently his son went to get a COVID-19 test, but the results are unknown at this time.    Clinical Impression  Patient functioning near baseline for functional mobility and gait, other than limited secondary to fatigue and SpO2 dropping from 90% to 86% during ambulation, put back on 2 LPM O2 after walking and SpO2 between 89-90%.  Patient demonstrates good return for transferring to commode in bathroom without loss of balance and tolerated sitting up in chair after therapy - RN notified.  Patient will benefit from continued physical therapy in hospital and recommended venue below to increase strength, balance,  endurance for safe ADLs and gait.     Follow Up Recommendations No PT follow up;Supervision - Intermittent    Equipment Recommendations  None recommended by PT    Recommendations for Other Services       Precautions / Restrictions Precautions Precautions: Fall Restrictions Weight Bearing Restrictions: No      Mobility  Bed Mobility Overal bed mobility: Modified Independent;Needs Assistance Bed Mobility: Supine to Sit;Sit to Supine     Supine to sit: Supervision Sit to supine: Modified independent (Device/Increase time)   General bed mobility comments: increased time for supine to sitting  Transfers Overall transfer level: Needs assistance Equipment used: None Transfers: Sit to/from [facility] Sit to Stand: Supervision Stand pivot transfers: Supervision          Ambulation/Gait Ambulation/Gait assistance: Supervision Gait Distance (Feet): 45 Feet Assistive device: None Gait Pattern/deviations: Decreased step length - right;Decreased step length - left;Decreased stride length Gait velocity: decreased   General Gait Details: slightly unsteady cadence without loss of balance, on room air with SpO2 dropping from 90% to 86% while walking, limited secondary to c/o fatigue  Stairs            Wheelchair Mobility    Modified Rankin (Stroke Patients Only)       Balance Overall balance assessment: Mild deficits observed, not formally tested                                           Pertinent Vitals/Pain Pain Assessment: No/denies pain  Home Living Family/patient expects to be discharged to:: Private residence Living Arrangements: Children Available Help at Discharge: Family;Available PRN/intermittently Type of Home: House Home Access: Stairs to enter Entrance Stairs-Rails: Right;Left;Can reach both Entrance Stairs-Number of Steps: 3 Home Layout: One level Home Equipment: Walker - 2 wheels;Cane - single point       Prior Function Level of Independence: Independent         Comments: Tourist information centre manager, drives     Higher education careers adviser        Extremity/Trunk Assessment   Upper Extremity Assessment Upper Extremity Assessment: Overall WFL for tasks assessed    Lower Extremity Assessment Lower Extremity Assessment: Generalized weakness    Cervical / Trunk Assessment Cervical / Trunk Assessment: Normal  Communication   Communication: HOH  Cognition Arousal/Alertness: Awake/alert Behavior During Therapy: WFL for tasks assessed/performed Overall Cognitive Status: Within Functional Limits for tasks assessed                                        General Comments      Exercises     Assessment/Plan    PT Assessment Patient needs continued PT services  PT Problem List Decreased strength;Decreased activity tolerance;Decreased balance;Decreased mobility       PT Treatment Interventions Balance training;Gait training;Stair training;Functional mobility training;Therapeutic activities;Therapeutic exercise;Patient/family education    PT Goals (Current goals can be found in the Care Plan section)  Acute Rehab PT Goals Patient Stated Goal: return home PT Goal Formulation: With patient Time For Goal Achievement: 07/01/19 Potential to Achieve Goals: Good    Frequency Min 3X/week   Barriers to discharge        Co-evaluation               AM-PAC PT "6 Clicks" Mobility  Outcome Measure Help needed turning from your back to your side while in a flat bed without using bedrails?: None Help needed moving from lying on your back to sitting on the side of a flat bed without using bedrails?: None Help needed moving to and from a bed to a chair (including a wheelchair)?: None Help needed standing up from a chair using your arms (e.g., wheelchair or bedside chair)?: None Help needed to walk in hospital room?: A Little Help needed climbing 3-5 steps with a railing? : A  Little 6 Click Score: 22    End of Session Equipment Utilized During Treatment: Oxygen Activity Tolerance: Patient tolerated treatment well Patient left: in chair;with call bell/phone within reach Nurse Communication: Mobility status PT Visit Diagnosis: Unsteadiness on feet (R26.81);Other abnormalities of gait and mobility (R26.89);Muscle weakness (generalized) (M62.81)    Time: 3086-5784 PT Time Calculation (min) (ACUTE ONLY): 34 min   Charges:   PT Evaluation $PT Eval Moderate Complexity: 1 Mod PT Treatments $Therapeutic Activity: 23-37 mins        10:14 AM, 06/24/19 Ocie Bob, MPT Physical Therapist with [facility] [phone] office [phone] mobile phone

## 2019-06-24 NOTE — Progress Notes (Signed)
1130: Arrived to pt room to find that pt had been up to bathroom for BM. Pt stooled on floor and self, also noted on toilet. Stool noted to be soft/loose and brown, no blood noted.  Pt had also pulled IV out going to BR and had removed his O2. Pt currently sitting in recliner, states, "I'm sorry, I cleaned it all up as best I could."  Soiled wash cloths and towels noted in trashcan, along with pt's soiled underwear. Linens placed in dirty linen bag, floor cleaned.  O2 placed on pt, pt denied any SOB, "except when I was running to the bathroom". Reminded pt to use his call bell (which was in the recliner with pt) when he needs to get up so that we can assist him and help prevent a fall. Pt stated understanding.  Previous IV site not bleeding, bruising noted just distal to site with tape still attached. Tape removed, bandage applied to old IV site. New IV inserted without difficulty, IVF restarted. Pt tolerated well.

## 2019-06-24 NOTE — Progress Notes (Addendum)
PROGRESS NOTE  Blake Collins PNP:[id number] DOB: 1934/06/26 DOA: 06/23/2019 PCP: Joette Catching, MD  Brief History:   85 y.o. male with medical history of diabetes mellitus type 2, hypertension, hyperlipidemia, CKD stage III, chronic low back pain, GERD, anxiety presenting with 1 to 2-week history of generalized weakness.  The patient has been complaining of some intermittent rectal bleeding for the better part of a week.  He denies any hemoptysis, hematemesis, melena, constipation, diarrhea, abdominal pain.  He has not had any fevers, chills, chest pain, cough, hemoptysis. The patient does complain of some intermittent shortness of breath over the past 2 to 3 days and worsening generalized weakness.  He denies any recent travels, worsening lower extremity edema or increasing abdominal girth or orthopnea.  As result, he presented for further evaluation.  He denies any falling or syncope.  He denies any new medications other than the fact that he received his first COVID-19 vaccination 3 days prior to this admission.  He denies any dysuria, hematuria, headache, neck pain, flank pain.  He quit smoking 20 years ago after approximately 60 pack-year history.  Notably, the patient states that his son with whom he lives has had some URI type symptoms.  Apparently his son went to get a COVID-19 test, but the results are unknown at this time. In the emergency department, the patient was afebrile hemodynamically stable with oxygen saturation 86% on room air.  He was placed on 2 L with oxygen saturation 95%.  BMP showed a serum creatinine 1.85 which is above his usual baseline.  LFTs were unremarkable.  WBC 8.3, hemoglobin 10.0, platelets 277,000.  Chest x-ray suggested possible hazy basilar opacities, cardiomegaly. Although initial POC SARS-CoV2 antigen was neg, his RT-PCR came back positive.  Assessment/Plan: Acute respiratory failure with hypoxia due COVID-19 pneumonia -Likely multifactorial including  underlying COPD, OSA and COVID-19 -Currently stable on 2 L nasal cannula -Chest x-ray does not show frank consolidation -Check procalcitonin 0.20 -LDH 253 -CRP 20.8>>20.2 -Ferritin 97>>115 -D-dimer--3.31>>3.17 -EKG shows sinus rhythm, IVCD, nonspecific ST ST wave changes -SARS-CoV2 RT-PCR--positive -check viral respiratory panel -continue remdesivir -start dexamethasone  Symptomatic anemia/rectal bleeding -Review of the medical record shows baseline hemoglobin 11-12 -GI consult appreciated -Clear liquid diet -Continue PPI -Type and screen  Acute on chronic renal failure--CKD stage IIIb -Baseline creatinine 1.2-1.5 -serum creatinine peaked 1.85 -Secondary to volume depletion -Gentle fluid hydration-->1.49 -Holding lisinopril  Pyuria -concerned about UTI -start ceftriaxone pending culture data  Generalized weakness -Likely multifactorial including COVID 19, symptomatic anemia, acute on chronic renal failure -UA and urine culture -Serum B12--1289 -folate--15.1 -TSH--1.296 -PT evaluation  Diabetes mellitus type 2 -Holding metformin and glipizide -NovoLog sliding scale -06/23/19 A1C--6.4  Essential hypertension -Holding lisinopril in the setting of soft blood pressure and acute on chronic renal failure  COPD -start combivent   Hyperlipidemia -Continue statin  Hypomagnesemia/hypophosphatemia -replete       Disposition Plan: Patient From: Home D/C Place: Home - 2-3  Days Barriers: Not Clinically Stable--GI eval pending  Family Communication:   Son updated on phone 3/28  Consultants:  GI  Code Status:  FULL   DVT Prophylaxis:  SCDs   Procedures: As Listed in Progress Note Above  Antibiotics: Ceftriaxone 3/28>>>       Subjective: Pt states he is breathing better, but has some dyspnea on exertion.  He denies cp, headache, n/v/d.  He has intermittent upper abd pain.  Had small amount of hematochezia last night.  Objective: Vitals:    06/23/19 2033 06/23/19 2135 06/24/19 0431 06/24/19 0736  BP:  115/62 117/75   Pulse: 87 85 90   Resp: 18 18 17    Temp:  97.8 F (36.6 C) 97.8 F (36.6 C)   TempSrc:  Oral Oral   SpO2: (!) 88% 94% 94% 94%  Weight:      Height:        Intake/Output Summary (Last 24 hours) at 06/24/2019 0738 Last data filed at 06/24/2019 0400 Gross per 24 hour  Intake 998.57 ml  Output 825 ml  Net 173.57 ml   Weight change:  Exam:   General:  Pt is alert, follows commands appropriately, not in acute distress  HEENT: No icterus, No thrush, No neck mass, [place]/AT  Cardiovascular: RRR, S1/S2, no rubs, no gallops  Respiratory: bibasilar rales. No wheeze.  Diminished BS  Abdomen: Soft/+BS, LUQ, RUQ tender, non distended, no guarding  Extremities: No edema, No lymphangitis, No petechiae, No rashes, no synovitis   Data Reviewed: I have personally reviewed following labs and imaging studies Basic Metabolic Panel: Recent Labs  Lab 06/23/19 1153 06/24/19 0615  NA 140 141  K 4.1 4.2  CL 108 108  CO2 20* 21*  GLUCOSE 134* 163*  BUN 37* 33*  CREATININE 1.85* 1.49*  CALCIUM 8.5* 8.9  MG  --  1.6*  PHOS  --  2.2*   Liver Function Tests: Recent Labs  Lab 06/23/19 1153 06/24/19 0615  AST 28 32  ALT 13 15  ALKPHOS 76 77  BILITOT 0.7 0.6  PROT 7.3 7.3  ALBUMIN 2.9* 2.7*   No results for input(s): LIPASE, AMYLASE in the last 168 hours. No results for input(s): AMMONIA in the last 168 hours. Coagulation Profile: No results for input(s): INR, PROTIME in the last 168 hours. CBC: Recent Labs  Lab 06/23/19 1153 06/24/19 0615  WBC 8.3 6.1  NEUTROABS 6.7 5.4  HGB 10.0* 10.4*  HCT 32.5* 34.4*  MCV 84.2 83.5  PLT 277 343   Cardiac Enzymes: No results for input(s): CKTOTAL, CKMB, CKMBINDEX, TROPONINI in the last 168 hours. BNP: Invalid input(s): POCBNP CBG: Recent Labs  Lab 06/23/19 1715 06/23/19 2130  GLUCAP 110* 156*   HbA1C: Recent Labs    06/23/19 1600  HGBA1C 6.4*    Urine analysis:    Component Value Date/Time   COLORURINE YELLOW 06/23/2019 1600   APPEARANCEUR HAZY (A) 06/23/2019 1600   LABSPEC 1.020 06/23/2019 1600   PHURINE 5.0 06/23/2019 1600   GLUCOSEU NEGATIVE 06/23/2019 1600   HGBUR NEGATIVE 06/23/2019 1600   BILIRUBINUR NEGATIVE 06/23/2019 1600   KETONESUR NEGATIVE 06/23/2019 1600   PROTEINUR 30 (A) 06/23/2019 1600   UROBILINOGEN 0.2 02/17/2011 1334   NITRITE NEGATIVE 06/23/2019 1600   LEUKOCYTESUR LARGE (A) 06/23/2019 1600   Sepsis Labs: @LABRCNTIP (procalcitonin:4,lacticidven:4) ) Recent Results (from the past 240 hour(s))  Respiratory Panel by RT PCR (Flu A&B, Covid) - Nasopharyngeal Swab     Status: Abnormal   Collection Time: 06/23/19 11:45 AM   Specimen: Nasopharyngeal Swab  Result Value Ref Range Status   SARS Coronavirus 2 by RT PCR POSITIVE (A) NEGATIVE Final    Comment: RESULT CALLED TO, READ BACK BY AND VERIFIED WITH: MARY @ 1518 ON BY HENDERSON L. (NOTE) SARS-CoV-2 target nucleic acids are DETECTED. SARS-CoV-2 RNA is generally detectable in upper respiratory specimens  during the acute phase of infection. Positive results are indicative of the presence of the identified virus, but do not rule out bacterial infection  or co-infection with other pathogens not detected by the test. Clinical correlation with patient history and other diagnostic information is necessary to determine patient infection status. The expected result is Negative. Fact Sheet for Patients:  PinkCheek.be Fact Sheet for Healthcare Providers: GravelBags.it This test is not yet approved or cleared by the [place] FDA and  has been authorized for detection and/or diagnosis of SARS-CoV-2 by FDA under an Emergency Use Authorization (EUA).  This EUA will remain in effect (meaning this test can be used)  for the duration of  the COVID-19 declaration under Section 564(b)(1) of the Act,  21 U.S.C. section 360bbb-3(b)(1), unless the authorization is terminated or revoked sooner.    Influenza A by PCR NEGATIVE NEGATIVE Final   Influenza B by PCR NEGATIVE NEGATIVE Final    Comment: (NOTE) The Xpert Xpress SARS-CoV-2/FLU/RSV assay is intended as an aid in  the diagnosis of influenza from Nasopharyngeal swab specimens and  should not be used as a sole basis for treatment. Nasal washings and  aspirates are unacceptable for Xpert Xpress SARS-CoV-2/FLU/RSV  testing. Fact Sheet for Patients: PinkCheek.be Fact Sheet for Healthcare Providers: GravelBags.it This test is not yet approved or cleared by the [place] FDA and  has been authorized for detection and/or diagnosis of SARS-CoV-2 by  FDA under an Emergency Use Authorization (EUA). This EUA will remain  in effect (meaning this test can be used) for the duration of the  Covid-19 declaration under Section 564(b)(1) of the Act, 21  U.S.C. section 360bbb-3(b)(1), unless the authorization is  terminated or revoked. Performed at [facility], [address] [place], [place] [address]      Scheduled Meds: . vitamin C  500 mg Oral Daily  . atorvastatin  20 mg Oral q morning - 10a  . dexamethasone (DECADRON) injection  6 mg Intravenous Daily  . insulin aspart  0-9 Units Subcutaneous TID WC  . Ipratropium-Albuterol  1 puff Inhalation Q6H  . loratadine  10 mg Oral q morning - 10a  . pantoprazole  40 mg Oral Daily  . [START ON 06/26/2019] pneumococcal 23 valent vaccine  0.5 mL Intramuscular Tomorrow-1000  . zinc sulfate  220 mg Oral Daily   Continuous Infusions: . sodium chloride 75 mL/hr at 06/24/19 0400  . remdesivir 100 mg in NS 100 mL      Procedures/Studies: DG Chest Port 1 View  Result Date: 06/23/2019 CLINICAL DATA:  Shortness of breath for 7 days EXAM: PORTABLE CHEST 1 VIEW COMPARISON:  None. FINDINGS: Mediastinal contour is normal. Heart size is  enlarged. There is mild hazy opacity of both lung bases. There is no pulmonary edema. No acute abnormalities identified in the visualized bony structures. IMPRESSION: Mild hazy opacity of both lung bases, developing pneumonia is not excluded. Electronically Signed   By: Abelardo Diesel M.D.   On: 06/23/2019 12:37    Orson Eva, DO  [facility]  If 7PM-7AM, please contact night-coverage [email or web address] Password [email or web address] 06/24/2019, 7:38 AM   LOS: 1 day

## 2019-06-25 LAB — CBC WITH DIFFERENTIAL/PLATELET
Abs Immature Granulocytes: 0.12 10*3/uL — ABNORMAL HIGH (ref 0.00–0.07)
Basophils Absolute: 0 10*3/uL (ref 0.0–0.1)
Basophils Relative: 0 %
Eosinophils Absolute: 0 10*3/uL (ref 0.0–0.5)
Eosinophils Relative: 0 %
HCT: 31.6 % — ABNORMAL LOW (ref 39.0–52.0)
Hemoglobin: 9.8 g/dL — ABNORMAL LOW (ref 13.0–17.0)
Immature Granulocytes: 1 %
Lymphocytes Relative: 7 %
Lymphs Abs: 0.7 10*3/uL (ref 0.7–4.0)
MCH: 25.6 pg — ABNORMAL LOW (ref 26.0–34.0)
MCHC: 31 g/dL (ref 30.0–36.0)
MCV: 82.5 fL (ref 80.0–100.0)
Monocytes Absolute: 0.7 10*3/uL (ref 0.1–1.0)
Monocytes Relative: 6 %
Neutro Abs: 8.9 10*3/uL — ABNORMAL HIGH (ref 1.7–7.7)
Neutrophils Relative %: 86 %
Platelets: 382 10*3/uL (ref 150–400)
RBC: 3.83 MIL/uL — ABNORMAL LOW (ref 4.22–5.81)
RDW: 15.6 % — ABNORMAL HIGH (ref 11.5–15.5)
WBC: 10.3 10*3/uL (ref 4.0–10.5)
nRBC: 0 % (ref 0.0–0.2)

## 2019-06-25 LAB — COMPREHENSIVE METABOLIC PANEL
ALT: 21 U/L (ref 0–44)
AST: 37 U/L (ref 15–41)
Albumin: 2.7 g/dL — ABNORMAL LOW (ref 3.5–5.0)
Alkaline Phosphatase: 66 U/L (ref 38–126)
Anion gap: 11 (ref 5–15)
BUN: 38 mg/dL — ABNORMAL HIGH (ref 8–23)
CO2: 22 mmol/L (ref 22–32)
Calcium: 8.9 mg/dL (ref 8.9–10.3)
Chloride: 109 mmol/L (ref 98–111)
Creatinine, Ser: 1.48 mg/dL — ABNORMAL HIGH (ref 0.61–1.24)
GFR calc Af Amer: 50 mL/min — ABNORMAL LOW (ref >60)
GFR calc non Af Amer: 43 mL/min — ABNORMAL LOW (ref >60)
Glucose, Bld: 142 mg/dL — ABNORMAL HIGH (ref 70–99)
Potassium: 3.8 mmol/L (ref 3.5–5.1)
Sodium: 142 mmol/L (ref 135–145)
Total Bilirubin: 0.4 mg/dL (ref 0.3–1.2)
Total Protein: 6.7 g/dL (ref 6.5–8.1)

## 2019-06-25 LAB — GLUCOSE, CAPILLARY
Glucose-Capillary: 130 mg/dL — ABNORMAL HIGH (ref 70–99)
Glucose-Capillary: 145 mg/dL — ABNORMAL HIGH (ref 70–99)
Glucose-Capillary: 157 mg/dL — ABNORMAL HIGH (ref 70–99)
Glucose-Capillary: 147 mg/dL — ABNORMAL HIGH (ref 70–99)

## 2019-06-25 LAB — URINE CULTURE: Culture: <10000 — AB

## 2019-06-25 LAB — C-REACTIVE PROTEIN: CRP: 7.8 mg/dL — ABNORMAL HIGH (ref <1.0)

## 2019-06-25 LAB — FERRITIN: Ferritin: 110 ng/mL (ref 24–336)

## 2019-06-25 LAB — D-DIMER, QUANTITATIVE: D-Dimer, Quant: 2.78 ug/mL-FEU — ABNORMAL HIGH (ref 0.00–0.50)

## 2019-06-25 LAB — PHOSPHORUS: Phosphorus: 2.5 mg/dL (ref 2.5–4.6)

## 2019-06-25 LAB — MAGNESIUM: Magnesium: 2.1 mg/dL (ref 1.7–2.4)

## 2019-06-25 MED ORDER — IPRATROPIUM-ALBUTEROL 20-100 MCG/ACT IN AERS
1.0000 | INHALATION_SPRAY | Freq: Three times a day (TID) | RESPIRATORY_TRACT | Status: DC
  Administered 2019-06-25 – 2019-06-26 (×3): 1 via RESPIRATORY_TRACT

## 2019-06-25 NOTE — Progress Notes (Signed)
PT Cancellation Note  Patient Details Name: Blake Collins MRN: [id number] DOB: January 28, 1935   Cancelled Treatment:    Reason Eval/Treat Not Completed: Fatigue/lethargy limiting ability to participate.  Patient declined therapy secondary to c/o fatigue, per RN patient has sat up in chair and has ambulated to bathroom in AM.   3:06 PM, 06/25/19 Ocie Bob, MPT Physical Therapist with [facility] 336 [phone] office [phone] mobile phone

## 2019-06-25 NOTE — Progress Notes (Signed)
PROGRESS NOTE  Blake Collins NIO:[id number] DOB: 19-Feb-1935 DOA: 06/23/2019 PCP: Joette Catching, MD  Brief History:  85 y.o.malewith medical history ofdiabetes mellitus type 2, hypertension, hyperlipidemia, CKD stage III, chronic low back pain, GERD, anxiety presenting with 1 to 2-week history of generalized weakness. The patient has been complaining of some intermittent rectal bleeding for the better part of a week. He denies any hemoptysis, hematemesis, melena, constipation, diarrhea, abdominal pain. He has not had any fevers, chills, chest pain, cough, hemoptysis. The patient does complain of some intermittent shortness of breath over the past 2 to 3 days and worsening generalized weakness. He denies any recent travels, worsening lower extremity edema or increasing abdominal girth or orthopnea. As result, he presented for further evaluation. He denies any falling or syncope. He denies any new medications other than the fact that he received his first COVID-19 vaccination 3 days prior to this admission. He denies any dysuria, hematuria, headache, neck pain, flank pain. He quit smoking 20 years ago after approximately 60pack-year history. Notably, the patient states that his son with whom he lives has had some URI type symptoms. Apparently his son went to get a COVID-19 test, but the results are unknown at this time. In the emergency department, the patient was afebrile hemodynamically stable with oxygen saturation 86% on room air. He was placed on 2 L with oxygen saturation 95%. BMP showed a serum creatinine 1.85 which is above his usual baseline. LFTs were unremarkable. WBC 8.3, hemoglobin 10.0, platelets 277,000. Chest x-ray suggested possible hazy basilar opacities, cardiomegaly. Although initial POC SARS-CoV2 antigen was neg, his RT-PCR came back positive.  Assessment/Plan: Acute respiratory failure with hypoxiadue COVID-19 pneumonia -Likely multifactorial including  underlying COPD, OSA and COVID-19 -Currently stable on 2 L nasal cannula -Chest x-ray does notshowfrank consolidation -Check procalcitonin 0.20 -LDH 253 -CRP 20.8>>20.2 -Ferritin 97>>115 -D-dimer--3.31>>3.17 -EKG shows sinus rhythm, IVCD, nonspecific ST ST wave changes -SARS-CoV2 RT-PCR--positive -check viral respiratory panel -continue remdesivir -continue dexamethasone  Symptomatic anemia/rectal bleeding -Review of the medical record shows baseline hemoglobin11-12 -GI consult appreciated -Clear liquid diet -Continue PPI -Type and screen  Acute on chronic renal failure--CKD stage IIIb -Baseline creatinine 1.2-1.5 -serum creatinine peaked 1.85 -Secondary to volume depletion -Gentle fluid hydration-->1.49 -Holding lisinopril  Pyuria -concerned about UTI -start ceftriaxone pending culture data  Generalized weakness -Likely multifactorial includingCOVID 19,symptomatic anemia, acute on chronic renal failure -UA and urine culture -SerumB12--1289 -folate--15.1 -TSH--1.296 -PT evaluation  Diabetes mellitus type 2 -Holding metformin and glipizide -NovoLog sliding scale -06/23/19 A1C--6.4  Essential hypertension -Holding lisinopril in the setting of soft blood pressure and acute on chronic renal failure -BP remains well controlled  COPD -start combivent   Hyperlipidemia -Continue statin  Hypomagnesemia/hypophosphatemia -replete       Disposition Plan: Patient From: Home D/C Place: Home - 06/27/19   Barriers: finishing remdesivir course  Family Communication:   Son updated on phone 3/28  Consultants:  GI  Code Status:  FULL   DVT Prophylaxis:  SCDs   Procedures: As Listed in Progress Note Above  Antibiotics: Ceftriaxone 3/28>>>      Subjective: Patient denies fevers, chills, headache, chest pain, dyspnea, nausea, vomiting, diarrhea, abdominal pain, dysuria, hematuria, hematochezia, and  melena.   Objective: Vitals:   06/24/19 1932 06/24/19 1947 06/24/19 2005 06/25/19 0433  BP: 134/63  (!) 121/52 112/68  Pulse: 81  85 86  Resp: 20  16 18   Temp: (!) 97.3 F (36.3 C)  97.8 F (36.6 C) 98 F (36.7 C)  TempSrc: Oral  Oral Oral  SpO2: 93% 91% 90% 90%  Weight:      Height:        Intake/Output Summary (Last 24 hours) at 06/25/2019 1007 Last data filed at 06/25/2019 0900 Gross per 24 hour  Intake 1986.59 ml  Output 1125 ml  Net 861.59 ml   Weight change:  Exam:   General:  Pt is alert, follows commands appropriately, not in acute distress  HEENT: No icterus, No thrush, No neck mass, [place]/AT  Cardiovascular: RRR, S1/S2, no rubs, no gallops  Respiratory: bibasilar crackles. No wheeze  Abdomen: Soft/+BS, non tender, non distended, no guarding  Extremities: No edema, No lymphangitis, No petechiae, No rashes, no synovitis   Data Reviewed: I have personally reviewed following labs and imaging studies Basic Metabolic Panel: Recent Labs  Lab 06/23/19 1153 06/24/19 0615 06/25/19 0907  NA 140 141 142  K 4.1 4.2 3.8  CL 108 108 109  CO2 20* 21* 22  GLUCOSE 134* 163* 142*  BUN 37* 33* 38*  CREATININE 1.85* 1.49* 1.48*  CALCIUM 8.5* 8.9 8.9  MG  --  1.6* 2.1  PHOS  --  2.2* 2.5   Liver Function Tests: Recent Labs  Lab 06/23/19 1153 06/24/19 0615 06/25/19 0907  AST 28 32 37  ALT 13 15 21   ALKPHOS 76 77 66  BILITOT 0.7 0.6 0.4  PROT 7.3 7.3 6.7  ALBUMIN 2.9* 2.7* 2.7*   No results for input(s): LIPASE, AMYLASE in the last 168 hours. No results for input(s): AMMONIA in the last 168 hours. Coagulation Profile: No results for input(s): INR, PROTIME in the last 168 hours. CBC: Recent Labs  Lab 06/23/19 1153 06/24/19 0615 06/25/19 0907  WBC 8.3 6.1 10.3  NEUTROABS 6.7 5.4 8.9*  HGB 10.0* 10.4* 9.8*  HCT 32.5* 34.4* 31.6*  MCV 84.2 83.5 82.5  PLT 277 343 382   Cardiac Enzymes: No results for input(s): CKTOTAL, CKMB, CKMBINDEX, TROPONINI in  the last 168 hours. BNP: Invalid input(s): POCBNP CBG: Recent Labs  Lab 06/24/19 1155 06/24/19 1216 06/24/19 1638 06/24/19 2157 06/25/19 0816  GLUCAP 189* 182* 162* 174* 130*   HbA1C: Recent Labs    06/23/19 1600  HGBA1C 6.4*   Urine analysis:    Component Value Date/Time   COLORURINE YELLOW 06/23/2019 1600   APPEARANCEUR HAZY (A) 06/23/2019 1600   LABSPEC 1.020 06/23/2019 1600   PHURINE 5.0 06/23/2019 1600   GLUCOSEU NEGATIVE 06/23/2019 1600   HGBUR NEGATIVE 06/23/2019 1600   BILIRUBINUR NEGATIVE 06/23/2019 1600   KETONESUR NEGATIVE 06/23/2019 1600   PROTEINUR 30 (A) 06/23/2019 1600   UROBILINOGEN 0.2 02/17/2011 1334   NITRITE NEGATIVE 06/23/2019 1600   LEUKOCYTESUR LARGE (A) 06/23/2019 1600   Sepsis Labs: @LABRCNTIP (procalcitonin:4,lacticidven:4) ) Recent Results (from the past 240 hour(s))  Respiratory Panel by RT PCR (Flu A&B, Covid) - Nasopharyngeal Swab     Status: Abnormal   Collection Time: 06/23/19 11:45 AM   Specimen: Nasopharyngeal Swab  Result Value Ref Range Status   SARS Coronavirus 2 by RT PCR POSITIVE (A) NEGATIVE Final    Comment: RESULT CALLED TO, READ BACK BY AND VERIFIED WITH: MARY @ 1518 ON BY HENDERSON L. (NOTE) SARS-CoV-2 target nucleic acids are DETECTED. SARS-CoV-2 RNA is generally detectable in upper respiratory specimens  during the acute phase of infection. Positive results are indicative of the presence of the identified virus, but do not rule out bacterial infection or co-infection with  other pathogens not detected by the test. Clinical correlation with patient history and other diagnostic information is necessary to determine patient infection status. The expected result is Negative. Fact Sheet for Patients:  PinkCheek.be Fact Sheet for Healthcare Providers: GravelBags.it This test is not yet approved or cleared by the [place] FDA and  has been authorized  for detection and/or diagnosis of SARS-CoV-2 by FDA under an Emergency Use Authorization (EUA).  This EUA will remain in effect (meaning this test can be used)  for the duration of  the COVID-19 declaration under Section 564(b)(1) of the Act, 21 U.S.C. section 360bbb-3(b)(1), unless the authorization is terminated or revoked sooner.    Influenza A by PCR NEGATIVE NEGATIVE Final   Influenza B by PCR NEGATIVE NEGATIVE Final    Comment: (NOTE) The Xpert Xpress SARS-CoV-2/FLU/RSV assay is intended as an aid in  the diagnosis of influenza from Nasopharyngeal swab specimens and  should not be used as a sole basis for treatment. Nasal washings and  aspirates are unacceptable for Xpert Xpress SARS-CoV-2/FLU/RSV  testing. Fact Sheet for Patients: PinkCheek.be Fact Sheet for Healthcare Providers: GravelBags.it This test is not yet approved or cleared by the [place] FDA and  has been authorized for detection and/or diagnosis of SARS-CoV-2 by  FDA under an Emergency Use Authorization (EUA). This EUA will remain  in effect (meaning this test can be used) for the duration of the  Covid-19 declaration under Section 564(b)(1) of the Act, 21  U.S.C. section 360bbb-3(b)(1), unless the authorization is  terminated or revoked. Performed at [facility], [address] [place], [place] [address]   Urine Culture     Status: Abnormal   Collection Time: 06/23/19  4:00 PM   Specimen: Urine, Clean Catch  Result Value Ref Range Status   Specimen Description   Final    URINE, CLEAN CATCH Performed at [facility], [address], [place], [place] [address]    Special Requests   Final    NONE Performed at [facility], [address], [place], [place] [address]    Culture (A)  Final    <10,000 COLONIES/mL INSIGNIFICANT GROWTH Performed at [facility] [address], [place], [place] [address]    Report Status 06/25/2019 FINAL  Final      Scheduled Meds: . vitamin C  500 mg Oral Daily  . atorvastatin  20 mg Oral q morning - 10a  . dexamethasone (DECADRON) injection  6 mg Intravenous Daily  . insulin aspart  0-9 Units Subcutaneous TID WC  . Ipratropium-Albuterol  1 puff Inhalation Q6H  . loratadine  10 mg Oral q morning - 10a  . pantoprazole  40 mg Oral Daily  . phosphorus  500 mg Oral TID  . [START ON 06/26/2019] pneumococcal 23 valent vaccine  0.5 mL Intramuscular Tomorrow-1000  . zinc sulfate  220 mg Oral Daily   Continuous Infusions: . sodium chloride 75 mL/hr at 06/25/19 0410  . cefTRIAXone (ROCEPHIN)  IV 1 g (06/25/19 0900)  . remdesivir 100 mg in NS 100 mL Stopped (06/24/19 1056)    Procedures/Studies: DG Chest Port 1 View  Result Date: 06/23/2019 CLINICAL DATA:  Shortness of breath for 7 days EXAM: PORTABLE CHEST 1 VIEW COMPARISON:  None. FINDINGS: Mediastinal contour is normal. Heart size is enlarged. There is mild hazy opacity of both lung bases. There is no pulmonary edema. No acute abnormalities identified in the visualized bony structures. IMPRESSION: Mild hazy opacity of both lung bases, developing pneumonia  is not excluded. Electronically Signed   By: Sherian Rein M.D.   On: 06/23/2019 12:37    Catarina Hartshorn, DO  [facility]  If 7PM-7AM, please contact night-coverage [email or web address] Password [email or web address] 06/25/2019, 10:07 AM   LOS: 2 days

## 2019-06-26 LAB — CBC WITH DIFFERENTIAL/PLATELET
Abs Immature Granulocytes: 0.09 10*3/uL — ABNORMAL HIGH (ref 0.00–0.07)
Basophils Absolute: 0 10*3/uL (ref 0.0–0.1)
Basophils Relative: 0 %
Eosinophils Absolute: 0 10*3/uL (ref 0.0–0.5)
Eosinophils Relative: 0 %
HCT: 29.7 % — ABNORMAL LOW (ref 39.0–52.0)
Hemoglobin: 9.1 g/dL — ABNORMAL LOW (ref 13.0–17.0)
Immature Granulocytes: 1 %
Lymphocytes Relative: 8 %
Lymphs Abs: 0.7 10*3/uL (ref 0.7–4.0)
MCH: 25.3 pg — ABNORMAL LOW (ref 26.0–34.0)
MCHC: 30.6 g/dL (ref 30.0–36.0)
MCV: 82.5 fL (ref 80.0–100.0)
Monocytes Absolute: 0.6 10*3/uL (ref 0.1–1.0)
Monocytes Relative: 7 %
Neutro Abs: 7.1 10*3/uL (ref 1.7–7.7)
Neutrophils Relative %: 84 %
Platelets: 407 10*3/uL — ABNORMAL HIGH (ref 150–400)
RBC: 3.6 MIL/uL — ABNORMAL LOW (ref 4.22–5.81)
RDW: 15.3 % (ref 11.5–15.5)
WBC: 8.5 10*3/uL (ref 4.0–10.5)
nRBC: 0 % (ref 0.0–0.2)

## 2019-06-26 LAB — COMPREHENSIVE METABOLIC PANEL
ALT: 21 U/L (ref 0–44)
AST: 34 U/L (ref 15–41)
Albumin: 2.4 g/dL — ABNORMAL LOW (ref 3.5–5.0)
Alkaline Phosphatase: 56 U/L (ref 38–126)
Anion gap: 10 (ref 5–15)
BUN: 40 mg/dL — ABNORMAL HIGH (ref 8–23)
CO2: 21 mmol/L — ABNORMAL LOW (ref 22–32)
Calcium: 8.3 mg/dL — ABNORMAL LOW (ref 8.9–10.3)
Chloride: 113 mmol/L — ABNORMAL HIGH (ref 98–111)
Creatinine, Ser: 1.32 mg/dL — ABNORMAL HIGH (ref 0.61–1.24)
GFR calc Af Amer: 58 mL/min — ABNORMAL LOW (ref >60)
GFR calc non Af Amer: 50 mL/min — ABNORMAL LOW (ref >60)
Glucose, Bld: 136 mg/dL — ABNORMAL HIGH (ref 70–99)
Potassium: 3.2 mmol/L — ABNORMAL LOW (ref 3.5–5.1)
Sodium: 144 mmol/L (ref 135–145)
Total Bilirubin: 0.6 mg/dL (ref 0.3–1.2)
Total Protein: 5.9 g/dL — ABNORMAL LOW (ref 6.5–8.1)

## 2019-06-26 LAB — C-REACTIVE PROTEIN: CRP: 3.9 mg/dL — ABNORMAL HIGH (ref <1.0)

## 2019-06-26 LAB — GLUCOSE, CAPILLARY
Glucose-Capillary: 104 mg/dL — ABNORMAL HIGH (ref 70–99)
Glucose-Capillary: 116 mg/dL — ABNORMAL HIGH (ref 70–99)
Glucose-Capillary: 165 mg/dL — ABNORMAL HIGH (ref 70–99)
Glucose-Capillary: 170 mg/dL — ABNORMAL HIGH (ref 70–99)
Glucose-Capillary: 180 mg/dL — ABNORMAL HIGH (ref 70–99)

## 2019-06-26 LAB — D-DIMER, QUANTITATIVE: D-Dimer, Quant: 2.54 ug/mL-FEU — ABNORMAL HIGH (ref 0.00–0.50)

## 2019-06-26 LAB — FERRITIN: Ferritin: 80 ng/mL (ref 24–336)

## 2019-06-26 MED ORDER — POTASSIUM CHLORIDE CRYS ER 20 MEQ PO TBCR
20.0000 meq | EXTENDED_RELEASE_TABLET | Freq: Once | ORAL | Status: AC
  Administered 2019-06-26: 20 meq via ORAL
  Filled 2019-06-26: qty 1

## 2019-06-26 MED ORDER — HALOPERIDOL LACTATE 5 MG/ML IJ SOLN
2.0000 mg | Freq: Four times a day (QID) | INTRAMUSCULAR | Status: DC | PRN
  Administered 2019-06-26 – 2019-06-27 (×2): 2 mg via INTRAVENOUS
  Filled 2019-06-26 (×2): qty 1

## 2019-06-26 NOTE — Progress Notes (Signed)
Physical Therapy Treatment Patient Details Name: Blake Collins MRN: [id number] DOB: 1934-12-24 Today's Date: 06/26/2019    History of Present Illness Blake Collins is a 85 y.o. male with medical history of diabetes mellitus type 2, hypertension, hyperlipidemia, CKD stage III, chronic low back pain, GERD, anxiety presenting with 1 to 2-week history of generalized weakness.  The patient has been complaining of some intermittent rectal bleeding for the better part of a week.  He denies any hemoptysis, hematemesis, melena, constipation, diarrhea, abdominal pain.  He has not had any fevers, chills, chest pain, cough, hemoptysis.The patient does complain of some intermittent shortness of breath over the past 2 to 3 days and worsening generalized weakness.  He denies any recent travels, worsening lower extremity edema or increasing abdominal girth or orthopnea.  As result, he presented for further evaluation.  He denies any falling or syncope.  He denies any new medications other than the fact that he received his first COVID-19 vaccination 3 days prior to this admission.  He denies any dysuria, hematuria, headache, neck pain, flank pain.  He quit smoking 20 years ago after approximately 60 pack-year history.  Notably, the patient states that his son with whom he lives has had some URI type symptoms.  Apparently his son went to get a COVID-19 test, but the results are unknown at this time.    PT Comments    Patient appears to be slightly confused requiring repeated verbal/tactile cueing to follow directions, unable to complete BLE exercises while seated at bedside due to poor carryover, easily distracted during ambulation with frequent stopping persevering over objects on walls and bed.  Patient on room air with SpO2 at 87% during ambulation and put back on 2 LPM after walking.  Patient tolerated sitting up in chair after therapy with chair alarm on - RN notified.  Patient will benefit from continued physical  therapy in hospital and recommended venue below to increase strength, balance, endurance for safe ADLs and gait.    Follow Up Recommendations  Home health PT     Equipment Recommendations  None recommended by PT    Recommendations for Other Services       Precautions / Restrictions Precautions Precautions: Fall Restrictions Weight Bearing Restrictions: No    Mobility  Bed Mobility Overal bed mobility: Needs Assistance Bed Mobility: Supine to Sit;Sit to Supine     Supine to sit: Supervision Sit to supine: Supervision   General bed mobility comments: requires repeated verbal/tacile cuing to follow directions  Transfers Overall transfer level: Needs assistance   Transfers: Sit to/from Stand;Stand Pivot Transfers Sit to Stand: Min guard Stand pivot transfers: Min guard       General transfer comment: slightly unsteady with frequent leaning on nearby objects for support  Ambulation/Gait Ambulation/Gait assistance: Min guard;Supervision Gait Distance (Feet): 25 Feet Assistive device: None Gait Pattern/deviations: Decreased step length - right;Decreased step length - left;Decreased stride length Gait velocity: decreased   General Gait Details: slow labored slightly unsteady cadence with frequent leaning on nearby objects for support, limited mostly due to fair/poor carryover for following instructions and very Catering manager    Modified Rankin (Stroke Patients Only)       Balance Overall balance assessment: Mild deficits observed, not formally tested  Cognition Arousal/Alertness: Awake/alert Behavior During Therapy: Flat affect;Impulsive Overall Cognitive Status: Impaired/Different from baseline Area of Impairment: Attention;Following commands;Awareness;Problem solving                   Current Attention Level: Selective;Alternating   Following  Commands: Follows one step commands with increased time     Problem Solving: Slow processing;Requires verbal cues;Requires tactile cues General Comments: Patient easily distracted requiring repeated verbal/tactile      Exercises      General Comments        Pertinent Vitals/Pain Pain Assessment: No/denies pain    Home Living                      Prior Function            PT Goals (current goals can now be found in the care plan section) Acute Rehab PT Goals Patient Stated Goal: return home PT Goal Formulation: With patient Time For Goal Achievement: 07/01/19 Potential to Achieve Goals: Good Progress towards PT goals: Progressing toward goals    Frequency    Min 3X/week      PT Plan Current plan remains appropriate    Co-evaluation              AM-PAC PT "6 Clicks" Mobility   Outcome Measure  Help needed turning from your back to your side while in a flat bed without using bedrails?: None Help needed moving from lying on your back to sitting on the side of a flat bed without using bedrails?: A Little Help needed moving to and from a bed to a chair (including a wheelchair)?: A Little Help needed standing up from a chair using your arms (e.g., wheelchair or bedside chair)?: A Little Help needed to walk in hospital room?: A Little Help needed climbing 3-5 steps with a railing? : A Lot 6 Click Score: 18    End of Session Equipment Utilized During Treatment: Oxygen Activity Tolerance: Patient tolerated treatment well;Patient limited by fatigue Patient left: in chair;with call bell/phone within reach;with chair alarm set Nurse Communication: Mobility status PT Visit Diagnosis: Unsteadiness on feet (R26.81);Other abnormalities of gait and mobility (R26.89);Muscle weakness (generalized) (M62.81)     Time: 4008-6761 PT Time Calculation (min) (ACUTE ONLY): 33 min  Charges:  $Therapeutic Activity: 23-37 mins                     12:32 PM,  06/26/19 Lonell Grandchild, MPT Physical Therapist with [facility] [phone] office [phone] mobile phone

## 2019-06-26 NOTE — Progress Notes (Addendum)
PROGRESS NOTE  Blake Collins KGM:[id number] DOB: [birth year] DOA: 06/23/2019 PCP: Dione Housekeeper, MD  Brief History: 85 y.o.malewith medical history ofdiabetes mellitus type 2, hypertension, hyperlipidemia, CKD stage III, chronic low back pain, GERD, anxiety presenting with 1 to 2-week history of generalized weakness. The patient has been complaining of some intermittent rectal bleeding for the better part of a week. He denies any hemoptysis, hematemesis, melena, constipation, diarrhea, abdominal pain. He has not had any fevers, chills, chest pain, cough, hemoptysis. The patient does complain of some intermittent shortness of breath over the past 2 to 3 days and worsening generalized weakness. He denies any recent travels, worsening lower extremity edema or increasing abdominal girth or orthopnea. As result, he presented for further evaluation. He denies any falling or syncope. He denies any new medications other than the fact that he received his first COVID-19 vaccination 3 days prior to this admission. He denies any dysuria, hematuria, headache, neck pain, flank pain. He quit smoking 20 years ago after approximately 60pack-year history. Notably, the patient states that his son with whom he lives has had some URI type symptoms. Apparently his son went to get a COVID-19 test, but the results are unknown at this time. In the emergency department, the patient was afebrile hemodynamically stable with oxygen saturation 86% on room air. He was placed on 2 L with oxygen saturation 95%. BMP showed a serum creatinine 1.85 which is above his usual baseline. LFTs were unremarkable. WBC 8.3, hemoglobin 10.0, platelets 277,000. Chest x-ray suggested possible hazy basilar opacities, cardiomegaly.Although initial POC SARS-CoV2 antigen was neg, his RT-PCR came back positive.  Assessment/Plan: Acute respiratory failure with hypoxiadue COVID-19 pneumonia -Likely multifactorial including  underlying COPD,OSA and COVID-19 -Currently stable on 2 L nasal cannula -Check procalcitonin0.20 -UYQ034 -CRP20.8>>20.2>>>3.9 -Ferritin97>>115>>>80 -D-dimer--3.31>>3.17>>2.54 -EKG shows sinus rhythm, IVCD, nonspecific ST ST wave changes -SARS-CoV2 RT-PCR--positive -continueremdesivir D#4/5 -continue dexamethasone  Symptomatic anemia/rectal bleeding -Review of the medical record shows baseline hemoglobin11-12 -GI consultappreciated--no intervention until recovered from [place] unless becomes unstable -Clear liquid diet>>>advance -ContinuePPI -Type and screen -Hgb stable--minimal drop due to dilution  Acute on chronic renal failure--CKD stage IIIb -Baseline creatinine 1.2-1.5 -serum creatinine peaked 1.85 -Secondary to volume depletion -Gentle fluid hydration-->1.32 -Holding lisinopril  Pyuria -concerned about UTI -start ceftriaxone pending culture data -urine culture <10K organisms -d/c ceftriaxone-->had 3 days  Cognitive impairment -likely has underlying dementia--daughter agrees  -having sundowning  -haldol prn agitation -outpatient neuropsych eval  Generalized weakness -Likely multifactorial includingCOVID 19,symptomatic anemia, acute on chronic renal failure -SerumB12--1289 -folate--15.1 -TSH--1.296 -PT evaluation-->HHPT  Diabetes mellitus type 2 -Holding metformin and glipizide -NovoLog sliding scale -06/23/19 A1C--6.4  Essential hypertension -Holding lisinopril in the setting of soft blood pressure and acute on chronic renal failure -BP remains well controlled  COPD -started combivent  Hyperlipidemia -Continue statin  Hypomagnesemia/hypophosphatemia -repleted       Disposition Plan: Patient From: Home D/C Place: Home- 06/27/19  Barriers: finishing remdesivir course  Family Communication:Daughter updated on phone 3/30  Consultants:GI  Code Status: FULL   DVT Prophylaxis:  SCDs   Procedures: As Listed in Progress Note Above  Antibiotics: Ceftriaxone 3/28>>>       Subjective: Patient denies fevers, chills, headache, chest pain, dyspnea, nausea, vomiting, diarrhea, abdominal pain, dysuria, hematuria, hematochezia, and melena.   Objective: Vitals:   06/25/19 2021 06/26/19 0645 06/26/19 0957 06/26/19 1427  BP: 140/74 123/65 132/65 (!) 127/93  Pulse: 79 79 81 97  Resp: 18 16  Temp: 97.7 F (36.5 C) 97.8 F (36.6 C) 97.8 F (36.6 C) 99.1 F (37.3 C)  TempSrc: Oral Oral Oral Axillary  SpO2: 93% (!) 85% 92% 93%  Weight:      Height:       No intake or output data in the 24 hours ending 06/26/19 1816 Weight change:  Exam:   General:  Pt is alert, follows commands appropriately, not in acute distress  HEENT: No icterus, No thrush, No neck mass, [place]/AT  Cardiovascular: RRR, S1/S2, no rubs, no gallops Respiratory: bibasilar crackles.  No wheeze  Abdomen: Soft/+BS, non tender, non distended, no guarding  Extremities: No edema, No lymphangitis, No petechiae, No rashes, no synovitis   Data Reviewed: I have personally reviewed following labs and imaging studies Basic Metabolic Panel: Recent Labs  Lab 06/23/19 1153 06/24/19 0615 06/25/19 0907 06/26/19 0433  NA 140 141 142 144  K 4.1 4.2 3.8 3.2*  CL 108 108 109 113*  CO2 20* 21* 22 21*  GLUCOSE 134* 163* 142* 136*  BUN 37* 33* 38* 40*  CREATININE 1.85* 1.49* 1.48* 1.32*  CALCIUM 8.5* 8.9 8.9 8.3*  MG  --  1.6* 2.1  --   PHOS  --  2.2* 2.5  --    Liver Function Tests: Recent Labs  Lab 06/23/19 1153 06/24/19 0615 06/25/19 0907 06/26/19 0433  AST 28 32 37 34  ALT 13 15 21 21   ALKPHOS 76 77 66 56  BILITOT 0.7 0.6 0.4 0.6  PROT 7.3 7.3 6.7 5.9*  ALBUMIN 2.9* 2.7* 2.7* 2.4*   No results for input(s): LIPASE, AMYLASE in the last 168 hours. No results for input(s): AMMONIA in the last 168 hours. Coagulation Profile: No results for input(s): INR, PROTIME in the last  168 hours. CBC: Recent Labs  Lab 06/23/19 1153 06/24/19 0615 06/25/19 0907 06/26/19 0433  WBC 8.3 6.1 10.3 8.5  NEUTROABS 6.7 5.4 8.9* 7.1  HGB 10.0* 10.4* 9.8* 9.1*  HCT 32.5* 34.4* 31.6* 29.7*  MCV 84.2 83.5 82.5 82.5  PLT 277 343 382 407*   Cardiac Enzymes: No results for input(s): CKTOTAL, CKMB, CKMBINDEX, TROPONINI in the last 168 hours. BNP: Invalid input(s): POCBNP CBG: Recent Labs  Lab 06/25/19 1751 06/25/19 2149 06/26/19 0848 06/26/19 1126 06/26/19 1657  GLUCAP 157* 147* 104* 116* 165*   HbA1C: No results for input(s): HGBA1C in the last 72 hours. Urine analysis:    Component Value Date/Time   COLORURINE YELLOW 06/23/2019 1600   APPEARANCEUR HAZY (A) 06/23/2019 1600   LABSPEC 1.020 06/23/2019 1600   PHURINE 5.0 06/23/2019 1600   GLUCOSEU NEGATIVE 06/23/2019 1600   HGBUR NEGATIVE 06/23/2019 1600   BILIRUBINUR NEGATIVE 06/23/2019 1600   KETONESUR NEGATIVE 06/23/2019 1600   PROTEINUR 30 (A) 06/23/2019 1600   UROBILINOGEN 0.2 02/17/2011 1334   NITRITE NEGATIVE 06/23/2019 1600   LEUKOCYTESUR LARGE (A) 06/23/2019 1600   Sepsis Labs: @LABRCNTIP (procalcitonin:4,lacticidven:4) ) Recent Results (from the past 240 hour(s))  Respiratory Panel by RT PCR (Flu A&B, Covid) - Nasopharyngeal Swab     Status: Abnormal   Collection Time: 06/23/19 11:45 AM   Specimen: Nasopharyngeal Swab  Result Value Ref Range Status   SARS Coronavirus 2 by RT PCR POSITIVE (A) NEGATIVE Final    Comment: RESULT CALLED TO, READ BACK BY AND VERIFIED WITH: MARY @ 1518 ON BY HENDERSON L. (NOTE) SARS-CoV-2 target nucleic acids are DETECTED. SARS-CoV-2 RNA is generally detectable in upper respiratory specimens  during the acute phase of infection. Positive results  are indicative of the presence of the identified virus, but do not rule out bacterial infection or co-infection with other pathogens not detected by the test. Clinical correlation with patient history and other  diagnostic information is necessary to determine patient infection status. The expected result is Negative. Fact Sheet for Patients:  https://www.moore.com/ Fact Sheet for Healthcare Providers: https://www.young.biz/ This test is not yet approved or cleared by the [place] FDA and  has been authorized for detection and/or diagnosis of SARS-CoV-2 by FDA under an Emergency Use Authorization (EUA).  This EUA will remain in effect (meaning this test can be used)  for the duration of  the COVID-19 declaration under Section 564(b)(1) of the Act, 21 U.S.C. section 360bbb-3(b)(1), unless the authorization is terminated or revoked sooner.    Influenza A by PCR NEGATIVE NEGATIVE Final   Influenza B by PCR NEGATIVE NEGATIVE Final    Comment: (NOTE) The Xpert Xpress SARS-CoV-2/FLU/RSV assay is intended as an aid in  the diagnosis of influenza from Nasopharyngeal swab specimens and  should not be used as a sole basis for treatment. Nasal washings and  aspirates are unacceptable for Xpert Xpress SARS-CoV-2/FLU/RSV  testing. Fact Sheet for Patients: https://www.moore.com/ Fact Sheet for Healthcare Providers: https://www.young.biz/ This test is not yet approved or cleared by the [place] FDA and  has been authorized for detection and/or diagnosis of SARS-CoV-2 by  FDA under an Emergency Use Authorization (EUA). This EUA will remain  in effect (meaning this test can be used) for the duration of the  Covid-19 declaration under Section 564(b)(1) of the Act, 21  U.S.C. section 360bbb-3(b)(1), unless the authorization is  terminated or revoked. Performed at [facility], [address] [place], [place] [address]   Urine Culture     Status: Abnormal   Collection Time: 06/23/19  4:00 PM   Specimen: Urine, Clean Catch  Result Value Ref Range Status   Specimen Description   Final    URINE, CLEAN CATCH Performed at  [facility], [address] [place], [place] [address]    Special Requests   Final    NONE Performed at [facility], [address] [place], [place] [address]    Culture (A)  Final    <10,000 COLONIES/mL INSIGNIFICANT GROWTH Performed at [facility], [address] [place], [place] [address]    Report Status 06/25/2019 FINAL  Final     Scheduled Meds: . vitamin C  500 mg Oral Daily  . atorvastatin  20 mg Oral q morning - 10a  . dexamethasone (DECADRON) injection  6 mg Intravenous Daily  . insulin aspart  0-9 Units Subcutaneous TID WC  . Ipratropium-Albuterol  1 puff Inhalation TID  . loratadine  10 mg Oral q morning - 10a  . pantoprazole  40 mg Oral Daily  . phosphorus  500 mg Oral TID  . zinc sulfate  220 mg Oral Daily   Continuous Infusions: . remdesivir 100 mg in NS 100 mL 100 mg (06/26/19 1004)    Procedures/Studies: DG Chest Port 1 View  Result Date: 06/23/2019 CLINICAL DATA:  Shortness of breath for 7 days EXAM: PORTABLE CHEST 1 VIEW COMPARISON:  None. FINDINGS: Mediastinal contour is normal. Heart size is enlarged. There is mild hazy opacity of both lung bases. There is no pulmonary edema. No acute abnormalities identified in the visualized bony structures. IMPRESSION: Mild hazy opacity of both lung bases, developing pneumonia is not excluded. Electronically Signed   By: Gabriel Carina.D.  On: 06/23/2019 12:37    Catarina Hartshorn, DO  [facility]  If 7PM-7AM, please contact night-coverage [email or web address] Password [email or web address] 06/26/2019, 6:16 PM   LOS: 3 days

## 2019-06-27 LAB — CBC WITH DIFFERENTIAL/PLATELET
Abs Immature Granulocytes: 0.1 10*3/uL — ABNORMAL HIGH (ref 0.00–0.07)
Basophils Absolute: 0 10*3/uL (ref 0.0–0.1)
Basophils Relative: 0 %
Eosinophils Absolute: 0 10*3/uL (ref 0.0–0.5)
Eosinophils Relative: 0 %
HCT: 31.6 % — ABNORMAL LOW (ref 39.0–52.0)
Hemoglobin: 9.7 g/dL — ABNORMAL LOW (ref 13.0–17.0)
Immature Granulocytes: 1 %
Lymphocytes Relative: 8 %
Lymphs Abs: 0.7 10*3/uL (ref 0.7–4.0)
MCH: 25.7 pg — ABNORMAL LOW (ref 26.0–34.0)
MCHC: 30.7 g/dL (ref 30.0–36.0)
MCV: 83.6 fL (ref 80.0–100.0)
Monocytes Absolute: 0.7 10*3/uL (ref 0.1–1.0)
Monocytes Relative: 8 %
Neutro Abs: 7.3 10*3/uL (ref 1.7–7.7)
Neutrophils Relative %: 83 %
Platelets: 417 10*3/uL — ABNORMAL HIGH (ref 150–400)
RBC: 3.78 MIL/uL — ABNORMAL LOW (ref 4.22–5.81)
RDW: 15.5 % (ref 11.5–15.5)
WBC: 8.9 10*3/uL (ref 4.0–10.5)
nRBC: 0.2 % (ref 0.0–0.2)

## 2019-06-27 LAB — COMPREHENSIVE METABOLIC PANEL
ALT: 25 U/L (ref 0–44)
AST: 34 U/L (ref 15–41)
Albumin: 2.6 g/dL — ABNORMAL LOW (ref 3.5–5.0)
Alkaline Phosphatase: 57 U/L (ref 38–126)
Anion gap: 10 (ref 5–15)
BUN: 42 mg/dL — ABNORMAL HIGH (ref 8–23)
CO2: 23 mmol/L (ref 22–32)
Calcium: 8.5 mg/dL — ABNORMAL LOW (ref 8.9–10.3)
Chloride: 112 mmol/L — ABNORMAL HIGH (ref 98–111)
Creatinine, Ser: 1.32 mg/dL — ABNORMAL HIGH (ref 0.61–1.24)
GFR calc Af Amer: 58 mL/min — ABNORMAL LOW (ref >60)
GFR calc non Af Amer: 50 mL/min — ABNORMAL LOW (ref >60)
Glucose, Bld: 132 mg/dL — ABNORMAL HIGH (ref 70–99)
Potassium: 3.3 mmol/L — ABNORMAL LOW (ref 3.5–5.1)
Sodium: 145 mmol/L (ref 135–145)
Total Bilirubin: 0.7 mg/dL (ref 0.3–1.2)
Total Protein: 6.2 g/dL — ABNORMAL LOW (ref 6.5–8.1)

## 2019-06-27 LAB — D-DIMER, QUANTITATIVE: D-Dimer, Quant: 2.73 ug/mL-FEU — ABNORMAL HIGH (ref 0.00–0.50)

## 2019-06-27 LAB — GLUCOSE, CAPILLARY: Glucose-Capillary: 111 mg/dL — ABNORMAL HIGH (ref 70–99)

## 2019-06-27 LAB — C-REACTIVE PROTEIN: CRP: 3.1 mg/dL — ABNORMAL HIGH (ref <1.0)

## 2019-06-27 LAB — FERRITIN: Ferritin: 61 ng/mL (ref 24–336)

## 2019-06-27 MED ORDER — HALOPERIDOL LACTATE 5 MG/ML IJ SOLN
5.0000 mg | Freq: Four times a day (QID) | INTRAMUSCULAR | Status: DC | PRN
  Administered 2019-06-27 – 2019-06-28 (×3): 5 mg via INTRAVENOUS
  Filled 2019-06-27 (×3): qty 1

## 2019-06-27 MED ORDER — LACTATED RINGERS IV BOLUS
1000.0000 mL | Freq: Once | INTRAVENOUS | Status: AC
  Administered 2019-06-27: 1000 mL via INTRAVENOUS

## 2019-06-27 MED ORDER — LORAZEPAM 2 MG/ML IJ SOLN
1.0000 mg | Freq: Once | INTRAMUSCULAR | Status: AC
  Administered 2019-06-27: 1 mg via INTRAVENOUS
  Filled 2019-06-27: qty 1

## 2019-06-27 MED ORDER — IPRATROPIUM-ALBUTEROL 20-100 MCG/ACT IN AERS: 1.0000 | INHALATION_SPRAY | Freq: Four times a day (QID) | RESPIRATORY_TRACT | Status: DC | PRN

## 2019-06-27 MED ORDER — QUETIAPINE FUMARATE 100 MG PO TABS
100.0000 mg | ORAL_TABLET | Freq: Every day | ORAL | Status: DC
  Administered 2019-06-28 – 2019-07-02 (×3): 100 mg via ORAL
  Filled 2019-06-27 (×6): qty 1

## 2019-06-27 MED ORDER — QUETIAPINE FUMARATE 100 MG PO TABS: 100.0000 mg | ORAL_TABLET | Freq: Two times a day (BID) | ORAL | Status: DC

## 2019-06-27 NOTE — Clinical Social Work Note (Signed)
Spoke with daughter regarding PT recommendations for SNF. Daughter will speak with brothers and return contact regarding SNF vs. HH. Patient from home with son, A&0 at baseline per daughter.   Deylan Canterbury, Juleen China, LCSW

## 2019-06-27 NOTE — Progress Notes (Signed)
PROGRESS NOTE  Blake Collins XHB:[id number] DOB: 06-10-1934 DOA: 06/23/2019 PCP: Joette Catching, MD  Brief History: 85 y.o.malewith medical history ofdiabetes mellitus type 2, hypertension, hyperlipidemia, CKD stage III, chronic low back pain, GERD, anxiety presenting with 1 to 2-week history of generalized weakness. The patient has been complaining of some intermittent rectal bleeding for the better part of a week. He denies any hemoptysis, hematemesis, melena, constipation, diarrhea, abdominal pain. He has not had any fevers, chills, chest pain, cough, hemoptysis. The patient does complain of some intermittent shortness of breath over the past 2 to 3 days and worsening generalized weakness. He denies any recent travels, worsening lower extremity edema or increasing abdominal girth or orthopnea. As result, he presented for further evaluation. He denies any falling or syncope. He denies any new medications other than the fact that he received his first COVID-19 vaccination 3 days prior to this admission. He denies any dysuria, hematuria, headache, neck pain, flank pain. He quit smoking 20 years ago after approximately 60pack-year history. Notably, the patient states that his son with whom he lives has had some URI type symptoms. Apparently his son went to get a COVID-19 test, but the results are unknown at this time. In the emergency department, the patient was afebrile hemodynamically stable with oxygen saturation 86% on room air. He was placed on 2 L with oxygen saturation 95%. BMP showed a serum creatinine 1.85 which is above his usual baseline. LFTs were unremarkable. WBC 8.3, hemoglobin 10.0, platelets 277,000. Chest x-ray suggested possible hazy basilar opacities, cardiomegaly.Although initial POC SARS-CoV2 antigen was neg, his RT-PCR came back positive.  Assessment/Plan: Acute respiratory failure with hypoxiadue COVID-19 pneumonia -Likely multifactorial including  underlying COPD,OSA and COVID-19 -Currently stable on 2 L nasal cannula -Check procalcitonin0.20 -YBO175 -CRP20.8>>20.2>>>3.9 -Ferritin97>>115>>>80 -D-dimer--3.31>>3.17>>2.54 -EKG shows sinus rhythm, IVCD, nonspecific ST ST wave changes -SARS-CoV2 RT-PCR--positive -He completed his course of remdesivir on 3/31 -continue dexamethasone  Symptomatic anemia/rectal bleeding -Review of the medical record shows baseline hemoglobin11-12 -GI consultappreciated--no intervention until recovered from COVID unless becomes unstable -Clear liquid diet>>>advance -ContinuePPI -Type and screen -Hgb stable--minimal drop due to dilution  Acute on chronic renal failure--CKD stage IIIb -Baseline creatinine 1.2-1.5 -serum creatinine peaked 1.85 -Secondary to volume depletion -Gentle fluid hydration-->1.32 -Holding lisinopril  Pyuria -concerned about UTI -start ceftriaxone pending culture data -urine culture <10K organisms -d/c ceftriaxone-->had 3 days  Cognitive impairment with delirium -likely has underlying dementia--daughter agrees  -Patient is having worsening agitation, refusing care -He has had a decline over the past few days and is currently not at his baseline -He is receiving Haldol as needed for agitation -We will start on Seroquel nightly to help with sundowning -He may have a component of dehydration since his p.o. intake has been poor -We will give him fluid bolus  Generalized weakness -Likely multifactorial includingCOVID 19,symptomatic anemia, acute on chronic renal failure -SerumB12--1289 -folate--15.1 -TSH--1.296 -PT evaluation-->SNF  Diabetes mellitus type 2 -Holding metformin and glipizide -NovoLog sliding scale -06/23/19 A1C--6.4  Essential hypertension -Holding lisinopril in the setting of soft blood pressure and acute on chronic renal failure -BP remains well controlled  COPD -started combivent  Hyperlipidemia -Continue  statin  Hypomagnesemia/hypophosphatemia -repleted    Disposition Plan: Patient From: Home D/C Place: Possible skilled nursing facility placement if family agrees Barriers:  Worsening mental status and worsening p.o. intake  Family Communication:Son updated on phone 3/31  Consultants:GI  Code Status: FULL   DVT Prophylaxis: SCDs  Procedures: As Listed in Progress Note Above  Antibiotics: Ceftriaxone 3/28>>>3/30   Subjective: Patient is unable to participate in history.  Staff notes a general decline over the past several days.  He is becoming increasingly confused and agitated.  P.o. intake has been poor yesterday and today.  He has been refusing his medications.   Objective: Vitals:   06/26/19 2029 06/26/19 2102 06/27/19 0617 06/27/19 1300  BP:  129/83 (!) 114/56 121/69  Pulse:  75 (!) 57 68  Resp:  20 18 18   Temp:  97.9 F (36.6 C) 98.9 F (37.2 C) 98.8 F (37.1 C)  TempSrc:  Oral Oral Oral  SpO2: 90% (!) 87% 91% 92%  Weight:      Height:        Intake/Output Summary (Last 24 hours) at 06/27/2019 1954 Last data filed at 06/27/2019 0900 Gross per 24 hour  Intake 0 ml  Output --  Net 0 ml   Weight change:  Exam:  General exam: patient is laying in bed, he is confused Respiratory system: Clear to auscultation. Respiratory effort normal. Cardiovascular system:RRR. No murmurs, rubs, gallops. Gastrointestinal system: Abdomen is nondistended, soft and nontender. No organomegaly or masses felt. Normal bowel sounds heard. Central nervous system:  No focal neurological deficits. Extremities: No C/C/E, +pedal pulses Skin: No rashes, lesions or ulcers  Psychiatry: confused, agitated, unable to answer questions or follow commands.    Data Reviewed: I have personally reviewed following labs and imaging studies Basic Metabolic Panel: Recent Labs  Lab 06/23/19 1153 06/24/19 0615 06/25/19 0907 06/26/19 0433 06/27/19 0600  NA 140 141  142 144 145  K 4.1 4.2 3.8 3.2* 3.3*  CL 108 108 109 113* 112*  CO2 20* 21* 22 21* 23  GLUCOSE 134* 163* 142* 136* 132*  BUN 37* 33* 38* 40* 42*  CREATININE 1.85* 1.49* 1.48* 1.32* 1.32*  CALCIUM 8.5* 8.9 8.9 8.3* 8.5*  MG  --  1.6* 2.1  --   --   PHOS  --  2.2* 2.5  --   --    Liver Function Tests: Recent Labs  Lab 06/23/19 1153 06/24/19 0615 06/25/19 0907 06/26/19 0433 06/27/19 0600  AST 28 32 37 34 34  ALT 13 15 21 21 25   ALKPHOS 76 77 66 56 57  BILITOT 0.7 0.6 0.4 0.6 0.7  PROT 7.3 7.3 6.7 5.9* 6.2*  ALBUMIN 2.9* 2.7* 2.7* 2.4* 2.6*   No results for input(s): LIPASE, AMYLASE in the last 168 hours. No results for input(s): AMMONIA in the last 168 hours. Coagulation Profile: No results for input(s): INR, PROTIME in the last 168 hours. CBC: Recent Labs  Lab 06/23/19 1153 06/24/19 0615 06/25/19 0907 06/26/19 0433 06/27/19 0600  WBC 8.3 6.1 10.3 8.5 8.9  NEUTROABS 6.7 5.4 8.9* 7.1 7.3  HGB 10.0* 10.4* 9.8* 9.1* 9.7*  HCT 32.5* 34.4* 31.6* 29.7* 31.6*  MCV 84.2 83.5 82.5 82.5 83.6  PLT 277 343 382 407* 417*   Cardiac Enzymes: No results for input(s): CKTOTAL, CKMB, CKMBINDEX, TROPONINI in the last 168 hours. BNP: Invalid input(s): POCBNP CBG: Recent Labs  Lab 06/26/19 1126 06/26/19 1657 06/26/19 2056 06/26/19 2108 06/27/19 0810  GLUCAP 116* 165* 170* 180* 111*   HbA1C: No results for input(s): HGBA1C in the last 72 hours. Urine analysis:    Component Value Date/Time   COLORURINE YELLOW 06/23/2019 1600   APPEARANCEUR HAZY (A) 06/23/2019 1600   LABSPEC 1.020 06/23/2019 1600   PHURINE 5.0 06/23/2019 1600   GLUCOSEU  NEGATIVE 06/23/2019 1600   HGBUR NEGATIVE 06/23/2019 1600   BILIRUBINUR NEGATIVE 06/23/2019 1600   KETONESUR NEGATIVE 06/23/2019 1600   PROTEINUR 30 (A) 06/23/2019 1600   UROBILINOGEN 0.2 02/17/2011 1334   NITRITE NEGATIVE 06/23/2019 1600   LEUKOCYTESUR LARGE (A) 06/23/2019 1600   Sepsis  Labs: @LABRCNTIP (procalcitonin:4,lacticidven:4) ) Recent Results (from the past 240 hour(s))  Respiratory Panel by RT PCR (Flu A&B, Covid) - Nasopharyngeal Swab     Status: Abnormal   Collection Time: 06/23/19 11:45 AM   Specimen: Nasopharyngeal Swab  Result Value Ref Range Status   SARS Coronavirus 2 by RT PCR POSITIVE (A) NEGATIVE Final    Comment: RESULT CALLED TO, READ BACK BY AND VERIFIED WITH: MARY @ 1518 ON 06/25/19 BY HENDERSON L. (NOTE) SARS-CoV-2 target nucleic acids are DETECTED. SARS-CoV-2 RNA is generally detectable in upper respiratory specimens  during the acute phase of infection. Positive results are indicative of the presence of the identified virus, but do not rule out bacterial infection or co-infection with other pathogens not detected by the test. Clinical correlation with patient history and other diagnostic information is necessary to determine patient infection status. The expected result is Negative. Fact Sheet for Patients:  [id number] Fact Sheet for Healthcare Providers: https://www.moore.com/ This test is not yet approved or cleared by the https://www.young.biz/ FDA and  has been authorized for detection and/or diagnosis of SARS-CoV-2 by FDA under an Emergency Use Authorization (EUA).  This EUA will remain in effect (meaning this test can be used)  for the duration of  the COVID-19 declaration under Section 564(b)(1) of the Act, 21 U.S.C. section 360bbb-3(b)(1), unless the authorization is terminated or revoked sooner.    Influenza A by PCR NEGATIVE NEGATIVE Final   Influenza B by PCR NEGATIVE NEGATIVE Final    Comment: (NOTE) The Xpert Xpress SARS-CoV-2/FLU/RSV assay is intended as an aid in  the diagnosis of influenza from Nasopharyngeal swab specimens and  should not be used as a sole basis for treatment. Nasal washings and  aspirates are unacceptable for Xpert Xpress SARS-CoV-2/FLU/RSV  testing. Fact Sheet  for Patients: [place] Fact Sheet for Healthcare Providers: https://www.moore.com/ This test is not yet approved or cleared by the https://www.young.biz/ FDA and  has been authorized for detection and/or diagnosis of SARS-CoV-2 by  FDA under an Emergency Use Authorization (EUA). This EUA will remain  in effect (meaning this test can be used) for the duration of the  Covid-19 declaration under Section 564(b)(1) of the Act, 21  U.S.C. section 360bbb-3(b)(1), unless the authorization is  terminated or revoked. Performed at [facility], [address] [place], [place]   Urine Culture     Status: Abnormal   Collection Time: 06/23/19  4:00 PM   Specimen: Urine, Clean Catch  Result Value Ref Range Status   Specimen Description   Final    URINE, CLEAN CATCH Performed at [facility], [address], [place], [place]    Special Requests   Final    NONE Performed at [facility], [address], [place], [place]    Culture (A)  Final    <10,000 COLONIES/mL INSIGNIFICANT GROWTH Performed at [facility], [address] [place], [place]    Report Status 06/25/2019 FINAL  Final     Scheduled Meds: . atorvastatin  20 mg Oral q morning - 10a  . dexamethasone (DECADRON) injection  6 mg Intravenous Daily  . insulin aspart  0-9 Units Subcutaneous TID WC  .  pantoprazole  40 mg Oral Daily  . QUEtiapine  100 mg Oral QHS  . zinc sulfate  220 mg Oral Daily   Continuous Infusions: . lactated ringers      Procedures/Studies: DG Chest Port 1 View  Result Date: 06/23/2019 CLINICAL DATA:  Shortness of breath for 7 days EXAM: PORTABLE CHEST 1 VIEW COMPARISON:  None. FINDINGS: Mediastinal contour is normal. Heart size is enlarged. There is mild hazy opacity of both lung bases. There is no pulmonary edema. No acute abnormalities identified in the visualized bony structures. IMPRESSION: Mild hazy opacity of both  lung bases, developing pneumonia is not excluded. Electronically Signed   By: Abelardo Diesel M.D.   On: 06/23/2019 12:37    Kathie Dike, MD  [facility]  If 7PM-7AM, please contact night-coverage [email or web address]  06/27/2019, 7:54 PM   LOS: 4 days

## 2019-06-27 NOTE — Progress Notes (Signed)
Physical Therapy Treatment Patient Details Name: Blake Collins MRN: [id number] DOB: 25-Jun-1934 Today's Date: 06/27/2019    History of Present Illness Blake Collins is a 85 y.o. male with medical history of diabetes mellitus type 2, hypertension, hyperlipidemia, CKD stage III, chronic low back pain, GERD, anxiety presenting with 1 to 2-week history of generalized weakness.  The patient has been complaining of some intermittent rectal bleeding for the better part of a week.  He denies any hemoptysis, hematemesis, melena, constipation, diarrhea, abdominal pain.  He has not had any fevers, chills, chest pain, cough, hemoptysis.The patient does complain of some intermittent shortness of breath over the past 2 to 3 days and worsening generalized weakness.  He denies any recent travels, worsening lower extremity edema or increasing abdominal girth or orthopnea.  As result, he presented for further evaluation.  He denies any falling or syncope.  He denies any new medications other than the fact that he received his first COVID-19 vaccination 3 days prior to this admission.  He denies any dysuria, hematuria, headache, neck pain, flank pain.  He quit smoking 20 years ago after approximately 60 pack-year history.  Notably, the patient states that his son with whom he lives has had some URI type symptoms.  Apparently his son went to get a COVID-19 test, but the results are unknown at this time.    PT Comments    Patient presents lying in bed undressed and appears confused.  Patient able to roll in bed left/right with supervision, but declined to attempt out of bed activity even with much confusion due to fatigue and/or confusion.  Patient will benefit from continued physical therapy in hospital and recommended venue below to increase strength, balance, endurance for safe ADLs and gait.   Follow Up Recommendations  SNF;Supervision for mobility/OOB;Supervision/Assistance - 24 hour     Equipment Recommendations   None recommended by PT    Recommendations for Other Services       Precautions / Restrictions Precautions Precautions: Fall Restrictions Weight Bearing Restrictions: No    Mobility  Bed Mobility Overal bed mobility: Needs Assistance Bed Mobility: Rolling Rolling: Supervision         General bed mobility comments: requires repeated verbal/tacile cuing to follow directions with poor carryover due to confusion  Transfers                    Ambulation/Gait                 Stairs             Wheelchair Mobility    Modified Rankin (Stroke Patients Only)       Balance                                            Cognition Arousal/Alertness: Awake/alert Behavior During Therapy: Flat affect;Impulsive;Agitated Overall Cognitive Status: Impaired/Different from baseline Area of Impairment: Attention;Following commands;Awareness;Problem solving                   Current Attention Level: Selective;Alternating   Following Commands: Follows one step commands inconsistently     Problem Solving: Decreased initiation General Comments: Patient easily distracted requiring repeated verbal/tactile      Exercises      General Comments        Pertinent Vitals/Pain Pain Assessment: No/denies pain    Home Living  Prior Function            PT Goals (current goals can now be found in the care plan section) Acute Rehab PT Goals Patient Stated Goal: return home PT Goal Formulation: With patient Time For Goal Achievement: 07/08/19 Potential to Achieve Goals: Fair Progress towards PT goals: Not progressing toward goals - comment(Patient confused)    Frequency    Min 3X/week      PT Plan Discharge plan needs to be updated    Co-evaluation              AM-PAC PT "6 Clicks" Mobility   Outcome Measure  Help needed turning from your back to your side while in a flat bed without  using bedrails?: None Help needed moving from lying on your back to sitting on the side of a flat bed without using bedrails?: A Little Help needed moving to and from a bed to a chair (including a wheelchair)?: A Little Help needed standing up from a chair using your arms (e.g., wheelchair or bedside chair)?: A Little Help needed to walk in hospital room?: A Little Help needed climbing 3-5 steps with a railing? : A Lot 6 Click Score: 18    End of Session   Activity Tolerance: Treatment limited secondary to agitation;Patient limited by fatigue Patient left: in bed;with call bell/phone within reach;with bed alarm set Nurse Communication: Mobility status PT Visit Diagnosis: Unsteadiness on feet (R26.81);Other abnormalities of gait and mobility (R26.89);Muscle weakness (generalized) (M62.81)     Time: 1025-8527 PT Time Calculation (min) (ACUTE ONLY): 14 min  Charges:  $Therapeutic Activity: 8-22 mins                     11:05 AM, 06/27/19 Blake Collins, MPT Physical Therapist with [facility] [phone] office [phone] mobile phone

## 2019-06-28 LAB — BASIC METABOLIC PANEL
Anion gap: 15 (ref 5–15)
BUN: 45 mg/dL — ABNORMAL HIGH (ref 8–23)
CO2: 23 mmol/L (ref 22–32)
Calcium: 8.8 mg/dL — ABNORMAL LOW (ref 8.9–10.3)
Chloride: 114 mmol/L — ABNORMAL HIGH (ref 98–111)
Creatinine, Ser: 1.52 mg/dL — ABNORMAL HIGH (ref 0.61–1.24)
GFR calc Af Amer: 49 mL/min — ABNORMAL LOW (ref >60)
GFR calc non Af Amer: 42 mL/min — ABNORMAL LOW (ref >60)
Glucose, Bld: 126 mg/dL — ABNORMAL HIGH (ref 70–99)
Potassium: 3.8 mmol/L (ref 3.5–5.1)
Sodium: 152 mmol/L — ABNORMAL HIGH (ref 135–145)

## 2019-06-28 LAB — URINALYSIS, ROUTINE W REFLEX MICROSCOPIC
Bacteria, UA: NONE SEEN
Bilirubin Urine: NEGATIVE
Glucose, UA: NEGATIVE mg/dL
Ketones, ur: 5 mg/dL — AB
Leukocytes,Ua: NEGATIVE
Nitrite: NEGATIVE
Protein, ur: 30 mg/dL — AB
Specific Gravity, Urine: 1.018 (ref 1.005–1.030)
pH: 5 (ref 5.0–8.0)

## 2019-06-28 LAB — GLUCOSE, CAPILLARY
Glucose-Capillary: 147 mg/dL — ABNORMAL HIGH (ref 70–99)
Glucose-Capillary: 133 mg/dL — ABNORMAL HIGH (ref 70–99)

## 2019-06-28 LAB — FERRITIN: Ferritin: 80 ng/mL (ref 24–336)

## 2019-06-28 LAB — C-REACTIVE PROTEIN: CRP: 4.9 mg/dL — ABNORMAL HIGH (ref <1.0)

## 2019-06-28 LAB — D-DIMER, QUANTITATIVE: D-Dimer, Quant: 3.33 ug/mL-FEU — ABNORMAL HIGH (ref 0.00–0.50)

## 2019-06-28 MED ORDER — LORAZEPAM 2 MG/ML IJ SOLN
INTRAMUSCULAR | Status: AC
  Administered 2019-06-28: 1 mg via INTRAVENOUS
  Filled 2019-06-28: qty 1

## 2019-06-28 MED ORDER — HYDROCORTISONE 0.5 % EX CREA
TOPICAL_CREAM | Freq: Two times a day (BID) | CUTANEOUS | Status: DC | PRN
  Filled 2019-06-28: qty 28.35

## 2019-06-28 MED ORDER — KCL IN DEXTROSE-NACL 20-5-0.45 MEQ/L-%-% IV SOLN: INTRAVENOUS | Status: DC

## 2019-06-28 MED ORDER — LORAZEPAM 2 MG/ML IJ SOLN
1.0000 mg | INTRAMUSCULAR | Status: DC | PRN
  Administered 2019-06-28 – 2019-07-02 (×9): 1 mg via INTRAVENOUS
  Filled 2019-06-28 (×9): qty 1

## 2019-06-28 MED ORDER — LACTATED RINGERS IV BOLUS
1000.0000 mL | INTRAVENOUS | Status: AC
  Administered 2019-06-28 (×2): 1000 mL via INTRAVENOUS

## 2019-06-28 NOTE — Progress Notes (Signed)
PROGRESS NOTE  Blake Collins CBJ:[id number] DOB: 1934/05/04 DOA: 06/23/2019 PCP: Dione Housekeeper, MD  Brief History: 85 y.o.malewith medical history ofdiabetes mellitus type 2, hypertension, hyperlipidemia, CKD stage III, chronic low back pain, GERD, anxiety presenting with 1 to 2-week history of generalized weakness. The patient has been complaining of some intermittent rectal bleeding for the better part of a week. He denies any hemoptysis, hematemesis, melena, constipation, diarrhea, abdominal pain. He has not had any fevers, chills, chest pain, cough, hemoptysis. The patient does complain of some intermittent shortness of breath over the past 2 to 3 days and worsening generalized weakness. He denies any recent travels, worsening lower extremity edema or increasing abdominal girth or orthopnea. As result, he presented for further evaluation. He denies any falling or syncope. He denies any new medications other than the fact that he received his first COVID-19 vaccination 3 days prior to this admission. He denies any dysuria, hematuria, headache, neck pain, flank pain. He quit smoking 20 years ago after approximately 60pack-year history. Notably, the patient states that his son with whom he lives has had some URI type symptoms. Apparently his son went to get a COVID-19 test, but the results are unknown at this time. In the emergency department, the patient was afebrile hemodynamically stable with oxygen saturation 86% on room air. He was placed on 2 L with oxygen saturation 95%. BMP showed a serum creatinine 1.85 which is above his usual baseline. LFTs were unremarkable. WBC 8.3, hemoglobin 10.0, platelets 277,000. Chest x-ray suggested possible hazy basilar opacities, cardiomegaly.Although initial POC SARS-CoV2 antigen was neg, his RT-PCR came back positive.  Assessment/Plan: Acute respiratory failure with hypoxiadue COVID-19 pneumonia -Likely multifactorial including  underlying COPD,OSA and COVID-19 -Currently stable on 2 L nasal cannula -Check procalcitonin0.20 -HYW737 -CRP20.8>>20.2>>>3.9 -Ferritin97>>115>>>80 -D-dimer--3.31>>3.17>>2.54 -EKG shows sinus rhythm, IVCD, nonspecific ST ST wave changes -SARS-CoV2 RT-PCR--positive -He completed his course of remdesivir on 3/31 -continue dexamethasone  Symptomatic anemia/rectal bleeding -Review of the medical record shows baseline hemoglobin11-12 -GI consultappreciated--no intervention until recovered from [place] unless becomes unstable -Clear liquid diet>>>advance -ContinuePPI -Type and screen -Hgb stable--minimal drop due to dilution  Acute on chronic renal failure--CKD stage IIIb -Baseline creatinine 1.2-1.5 -serum creatinine peaked 1.85 -Secondary to volume depletion -P.o. intake has been poor the last few days and creatinine is now trending up from 1.3-1.5. -Continue hydration -Holding lisinopril  Pyuria -concerned about UTI -start ceftriaxone pending culture data -urine culture <10K organisms -d/c ceftriaxone-->had 3 days  Cognitive impairment with delirium -likely has underlying dementia--daughter agrees  -Patient continues with worsening agitation, refusing care -He has had a decline over the past few days and is currently not at his baseline -He is receiving Haldol/Ativan as needed for agitation -Started on Seroquel nightly to help with sundowning -He may have a component of dehydration since his p.o. intake has been poor -We will give him fluid bolus -If mental status not improved with hydration, will need to consider neuroimaging. -He is mildly febrile today which could also be contributing to confusion/agitation  Generalized weakness -Likely multifactorial includingCOVID 19,symptomatic anemia, acute on chronic renal failure -SerumB12--1289 -folate--15.1 -TSH--1.296 -PT evaluation-->SNF  Diabetes mellitus type 2 -Holding metformin and glipizide -NovoLog  sliding scale -06/23/19 A1C--6.4  Essential hypertension -Holding lisinopril in the setting of soft blood pressure and acute on chronic renal failure -BP remains well controlled  COPD -started combivent  Hyperlipidemia -Continue statin  Hypomagnesemia/hypophosphatemia -repleted  Fever -Patient noted to have low-grade fever today. -  This may be residual effect from Covid infection -We will repeat urinalysis    Disposition Plan: Patient From: Home D/C Place: Skilled nursing facility placement once mental status has improved to baseline Barriers:  Worsening mental status and worsening p.o. intake  Family Communication:Daughter updated on phone 4/1  Consultants:GI  Code Status: FULL   DVT Prophylaxis: SCDs   Procedures: As Listed in Progress Note Above  Antibiotics: Ceftriaxone 3/28>>>3/30   Subjective: Patient is unable to participate in history.  Staff notes a general decline over the past several days.  He is becoming increasingly confused and agitated.  P.o. intake has been poor yesterday and today.  He has been refusing his medications.   Objective: Vitals:   06/27/19 1300 06/28/19 0900 06/28/19 0905 06/28/19 1703  BP: 121/69 140/63  103/60  Pulse: 68 100  84  Resp: 18     Temp: 98.8 F (37.1 C) 100.3 F (37.9 C) (!) 100.4 F (38 C) 99.8 F (37.7 C)  TempSrc: Oral Axillary Rectal Axillary  SpO2: 92% 91%    Weight:      Height:        Intake/Output Summary (Last 24 hours) at 06/28/2019 1912 Last data filed at 06/28/2019 1900 Gross per 24 hour  Intake 0 ml  Output -  Net 0 ml   Weight change:  Exam:  General exam: patient is laying in bed, he is confused Respiratory system: Clear to auscultation. Respiratory effort normal. Cardiovascular system:RRR. No murmurs, rubs, gallops. Gastrointestinal system: Abdomen is nondistended, soft and nontender. No organomegaly or masses felt. Normal bowel sounds heard. Central nervous  system:  No focal neurological deficits. Extremities: No C/C/E, +pedal pulses Skin: No rashes, lesions or ulcers  Psychiatry: confused, agitated, unable to answer questions or follow commands.    Data Reviewed: I have personally reviewed following labs and imaging studies Basic Metabolic Panel: Recent Labs  Lab 06/24/19 0615 06/25/19 0907 06/26/19 0433 06/27/19 0600 06/28/19 0451  NA 141 142 144 145 152*  K 4.2 3.8 3.2* 3.3* 3.8  CL 108 109 113* 112* 114*  CO2 21* 22 21* 23 23  GLUCOSE 163* 142* 136* 132* 126*  BUN 33* 38* 40* 42* 45*  CREATININE 1.49* 1.48* 1.32* 1.32* 1.52*  CALCIUM 8.9 8.9 8.3* 8.5* 8.8*  MG 1.6* 2.1  --   --   --   PHOS 2.2* 2.5  --   --   --    Liver Function Tests: Recent Labs  Lab 06/23/19 1153 06/24/19 0615 06/25/19 0907 06/26/19 0433 06/27/19 0600  AST 28 32 37 34 34  ALT 13 15 21 21 25   ALKPHOS 76 77 66 56 57  BILITOT 0.7 0.6 0.4 0.6 0.7  PROT 7.3 7.3 6.7 5.9* 6.2*  ALBUMIN 2.9* 2.7* 2.7* 2.4* 2.6*   No results for input(s): LIPASE, AMYLASE in the last 168 hours. No results for input(s): AMMONIA in the last 168 hours. Coagulation Profile: No results for input(s): INR, PROTIME in the last 168 hours. CBC: Recent Labs  Lab 06/23/19 1153 06/24/19 0615 06/25/19 0907 06/26/19 0433 06/27/19 0600  WBC 8.3 6.1 10.3 8.5 8.9  NEUTROABS 6.7 5.4 8.9* 7.1 7.3  HGB 10.0* 10.4* 9.8* 9.1* 9.7*  HCT 32.5* 34.4* 31.6* 29.7* 31.6*  MCV 84.2 83.5 82.5 82.5 83.6  PLT 277 343 382 407* 417*   Cardiac Enzymes: No results for input(s): CKTOTAL, CKMB, CKMBINDEX, TROPONINI in the last 168 hours. BNP: Invalid input(s): POCBNP CBG: Recent Labs  Lab 06/26/19 1657 06/26/19  2056 06/26/19 2108 06/27/19 0810 06/28/19 1647  GLUCAP 165* 170* 180* 111* 147*   HbA1C: No results for input(s): HGBA1C in the last 72 hours. Urine analysis:    Component Value Date/Time   COLORURINE YELLOW 06/23/2019 1600   APPEARANCEUR HAZY (A) 06/23/2019 1600    LABSPEC 1.020 06/23/2019 1600   PHURINE 5.0 06/23/2019 1600   GLUCOSEU NEGATIVE 06/23/2019 1600   HGBUR NEGATIVE 06/23/2019 1600   BILIRUBINUR NEGATIVE 06/23/2019 1600   KETONESUR NEGATIVE 06/23/2019 1600   PROTEINUR 30 (A) 06/23/2019 1600   UROBILINOGEN 0.2 02/17/2011 1334   NITRITE NEGATIVE 06/23/2019 1600   LEUKOCYTESUR LARGE (A) 06/23/2019 1600   Sepsis Labs: @LABRCNTIP (procalcitonin:4,lacticidven:4) ) Recent Results (from the past 240 hour(s))  Respiratory Panel by RT PCR (Flu A&B, Covid) - Nasopharyngeal Swab     Status: Abnormal   Collection Time: 06/23/19 11:45 AM   Specimen: Nasopharyngeal Swab  Result Value Ref Range Status   SARS Coronavirus 2 by RT PCR POSITIVE (A) NEGATIVE Final    Comment: RESULT CALLED TO, READ BACK BY AND VERIFIED WITH: MARY @ 1518 ON 06/25/19 BY HENDERSON L. (NOTE) SARS-CoV-2 target nucleic acids are DETECTED. SARS-CoV-2 RNA is generally detectable in upper respiratory specimens  during the acute phase of infection. Positive results are indicative of the presence of the identified virus, but do not rule out bacterial infection or co-infection with other pathogens not detected by the test. Clinical correlation with patient history and other diagnostic information is necessary to determine patient infection status. The expected result is Negative. Fact Sheet for Patients:  U622787 Fact Sheet for Healthcare Providers: https://www.moore.com/ This test is not yet approved or cleared by the https://www.young.biz/ FDA and  has been authorized for detection and/or diagnosis of SARS-CoV-2 by FDA under an Emergency Use Authorization (EUA).  This EUA will remain in effect (meaning this test can be used)  for the duration of  the COVID-19 declaration under Section 564(b)(1) of the Act, 21 U.S.C. section 360bbb-3(b)(1), unless the authorization is terminated or revoked sooner.    Influenza A by PCR NEGATIVE  NEGATIVE Final   Influenza B by PCR NEGATIVE NEGATIVE Final    Comment: (NOTE) The Xpert Xpress SARS-CoV-2/FLU/RSV assay is intended as an aid in  the diagnosis of influenza from Nasopharyngeal swab specimens and  should not be used as a sole basis for treatment. Nasal washings and  aspirates are unacceptable for Xpert Xpress SARS-CoV-2/FLU/RSV  testing. Fact Sheet for Patients: [place] Fact Sheet for Healthcare Providers: https://www.moore.com/ This test is not yet approved or cleared by the https://www.young.biz/ FDA and  has been authorized for detection and/or diagnosis of SARS-CoV-2 by  FDA under an Emergency Use Authorization (EUA). This EUA will remain  in effect (meaning this test can be used) for the duration of the  Covid-19 declaration under Section 564(b)(1) of the Act, 21  U.S.C. section 360bbb-3(b)(1), unless the authorization is  terminated or revoked. Performed at [facility], [address] [place], [place]   Urine Culture     Status: Abnormal   Collection Time: 06/23/19  4:00 PM   Specimen: Urine, Clean Catch  Result Value Ref Range Status   Specimen Description   Final    URINE, CLEAN CATCH Performed at [facility], [address] [place], [place]    Special Requests   Final    NONE Performed at [facility], [address] [place], [place]    Culture (A)  Final    <  10,000 COLONIES/mL INSIGNIFICANT GROWTH Performed at [facility], [address] [place], [place] [address]    Report Status 06/25/2019 FINAL  Final     Scheduled Meds: . atorvastatin  20 mg Oral q morning - 10a  . dexamethasone (DECADRON) injection  6 mg Intravenous Daily  . insulin aspart  0-9 Units Subcutaneous TID WC  . pantoprazole  40 mg Oral Daily  . QUEtiapine  100 mg Oral QHS  . zinc sulfate  220 mg Oral Daily   Continuous Infusions: . dextrose 5 % and 0.45 % NaCl with KCl 20 mEq/L       Procedures/Studies: DG Chest Port 1 View  Result Date: 06/23/2019 CLINICAL DATA:  Shortness of breath for 7 days EXAM: PORTABLE CHEST 1 VIEW COMPARISON:  None. FINDINGS: Mediastinal contour is normal. Heart size is enlarged. There is mild hazy opacity of both lung bases. There is no pulmonary edema. No acute abnormalities identified in the visualized bony structures. IMPRESSION: Mild hazy opacity of both lung bases, developing pneumonia is not excluded. Electronically Signed   By: Sherian Rein M.D.   On: 06/23/2019 12:37    Erick Blinks, MD  [facility]  If 7PM-7AM, please contact night-coverage [email or web address]  06/28/2019, 7:12 PM   LOS: 5 days

## 2019-06-28 NOTE — Care Management Important Message (Signed)
Important Message  Patient Details  Name: ARTHER HEISLER MRN: [id number] Date of Birth: 1934/05/02   Medicare Important Message Given:  Yes - Important Message mailed due to current National Emergency   Verbal consent obtained due to current National Emergency  Relationship to patient: Child Contact Name: [name] Call Date: 06/27/19  Time: 0400 Phone: [phone] Outcome: Spoke with contact Important Message mailed to: Patient address on file     Corey Harold 06/28/2019, 2:20 PM

## 2019-06-28 NOTE — NC FL2 (Signed)
[place] MEDICAID FL2 LEVEL OF CARE SCREENING TOOL     IDENTIFICATION  Patient Name: Blake Collins Birthdate: 06/02/1934 Sex: male Admission Date (Current Location): 06/23/2019  [facility] and [place] Number:  [employer] and Address:  [facility],  [address] [place] [address]      Provider Number: [id number]  Attending Physician Name and Address:  Erick Blinks, MD  Relative Name and Phone Number:       Current Level of Care: Hospital Recommended Level of Care: Skilled Nursing Facility Prior Approval Number:    Date Approved/Denied:   PASRR Number:    Discharge Plan: SNF    Current Diagnoses: Patient Active Problem List   Diagnosis Date Noted  . Acute respiratory failure due to COVID-19 (HCC) 06/24/2019  . Symptomatic anemia 06/24/2019  . Acute respiratory failure with hypoxia (HCC) 06/23/2019  . Generalized weakness 06/23/2019  . Acute renal failure superimposed on stage 3b chronic kidney disease (HCC) 06/23/2019  . Lower GI bleed 06/23/2019  . Anemia     Orientation RESPIRATION BLADDER Height & Weight        O2(2L) Incontinent Weight: 230 lb (104.3 kg) Height:  5\' 10"  (177.8 cm)  BEHAVIORAL SYMPTOMS/MOOD NEUROLOGICAL BOWEL NUTRITION STATUS  Other (Comment)(Confused, refusing. Per family he is A&O at baseline.)   Incontinent Diet(heart healthy)  AMBULATORY STATUS COMMUNICATION OF NEEDS Skin   Limited Assist Verbally Normal                       Personal Care Assistance Level of Assistance  Bathing, Dressing, Feeding Bathing Assistance: Limited assistance Feeding assistance: Limited assistance Dressing Assistance: Limited assistance     Functional Limitations Info  Sight, Speech, Hearing Sight Info: Adequate Hearing Info: Adequate Speech Info: Adequate    SPECIAL CARE FACTORS FREQUENCY  PT (By licensed PT)     PT Frequency: 5x/week              Contractures Contractures Info: Not present    Additional  Factors Info  Code Status, Allergies, Psychotropic Code Status Info: Full Code Allergies Info: NKA Psychotropic Info: Klonopin         Current Medications (06/28/2019):  This is the current hospital active medication list Current Facility-Administered Medications  Medication Dose Route Frequency Provider Last Rate Last Admin  . acetaminophen (TYLENOL) tablet 650 mg  650 mg Oral Q6H PRN Tat, 08/28/2019, MD   650 mg at 06/24/19 2235   Or  . acetaminophen (TYLENOL) suppository 650 mg  650 mg Rectal Q6H PRN Tat, 2236, MD      . atorvastatin (LIPITOR) tablet 20 mg  20 mg Oral q morning - 10a Onalee Hua, MD   20 mg at 06/26/19 0959  . dexamethasone (DECADRON) injection 6 mg  6 mg Intravenous Daily Tat, David, MD   6 mg at 06/28/19 0929  . haloperidol lactate (HALDOL) injection 5 mg  5 mg Intravenous Q6H PRN 08/28/19, MD   5 mg at 06/28/19 0133  . insulin aspart (novoLOG) injection 0-9 Units  0-9 Units Subcutaneous TID WC 08/28/19, MD   2 Units at 06/26/19 1711  . Ipratropium-Albuterol (COMBIVENT) respimat 1 puff  1 puff Inhalation Q6H PRN Memon, Jehanzeb, MD      . lactated ringers bolus 1,000 mL  1,000 mL Intravenous Q1 Hr x 2 Memon, Jehanzeb, MD      . LORazepam (ATIVAN) injection 1 mg  1 mg Intravenous Q4H PRN 06/28/19, MD      .  ondansetron (ZOFRAN) tablet 4 mg  4 mg Oral Q6H PRN Tat, David, MD       Or  . ondansetron (ZOFRAN) injection 4 mg  4 mg Intravenous Q6H PRN Tat, David, MD      . pantoprazole (PROTONIX) EC tablet 40 mg  40 mg Oral Daily Tat, David, MD   40 mg at 06/26/19 1000  . QUEtiapine (SEROQUEL) tablet 100 mg  100 mg Oral QHS Memon, Jolaine Artist, MD      . zinc sulfate capsule 220 mg  220 mg Oral Daily Tat, David, MD   220 mg at 06/26/19 1000     Discharge Medications: Please see discharge summary for a list of discharge medications.  Relevant Imaging Results:  Relevant Lab Results:   Additional Information SSN [id number]. COVID + on 06/23/19  Ihor Gully, LCSW

## 2019-06-28 NOTE — Progress Notes (Signed)
Pt has been agitated and restless entire shift thus far. Refused po meds x2 attempts and refused to let NT and RN take vital signs by squirming and holding on to hands of staff and at times being combative. MD notified earlier this shift with new order for Ativan injection 0.5 mL once with minimal results. Resident not combative at this time but continues to squirm around in the bed and remove clothing.  Garlan Fillers pt son called for update and made aware that there was no change from when he saw his dad earlier this shift. Son apologized on behalf of his dad and stated a decision has been made for his dad to go to the [facility]. Call bell in reach. Bed in lowest position.

## 2019-06-28 NOTE — Clinical Social Work Note (Signed)
Spoke with patient's daughter, Marylu Lund. Marylu Lund stated that she had spoken with her brother and they have agreed that patient should discharge to SNF for short term rehabilitation. Discussed facilities accepting COVID+ patients.  Referrals made to facilities.     Viktor Philipp, Juleen China, LCSW

## 2019-06-29 ENCOUNTER — Inpatient Hospital Stay (HOSPITAL_COMMUNITY): Payer: Medicare HMO

## 2019-06-29 LAB — D-DIMER, QUANTITATIVE: D-Dimer, Quant: 2.86 ug/mL-FEU — ABNORMAL HIGH (ref 0.00–0.50)

## 2019-06-29 LAB — COMPREHENSIVE METABOLIC PANEL
ALT: 66 U/L — ABNORMAL HIGH (ref 0–44)
AST: 123 U/L — ABNORMAL HIGH (ref 15–41)
Albumin: 2.5 g/dL — ABNORMAL LOW (ref 3.5–5.0)
Alkaline Phosphatase: 57 U/L (ref 38–126)
Anion gap: 8 (ref 5–15)
BUN: 38 mg/dL — ABNORMAL HIGH (ref 8–23)
CO2: 25 mmol/L (ref 22–32)
Calcium: 8.3 mg/dL — ABNORMAL LOW (ref 8.9–10.3)
Chloride: 114 mmol/L — ABNORMAL HIGH (ref 98–111)
Creatinine, Ser: 1.25 mg/dL — ABNORMAL HIGH (ref 0.61–1.24)
GFR calc Af Amer: >60 mL/min (ref >60)
GFR calc non Af Amer: 53 mL/min — ABNORMAL LOW (ref >60)
Glucose, Bld: 152 mg/dL — ABNORMAL HIGH (ref 70–99)
Potassium: 4 mmol/L (ref 3.5–5.1)
Sodium: 147 mmol/L — ABNORMAL HIGH (ref 135–145)
Total Bilirubin: 0.8 mg/dL (ref 0.3–1.2)
Total Protein: 6 g/dL — ABNORMAL LOW (ref 6.5–8.1)

## 2019-06-29 LAB — CBC
HCT: 32.2 % — ABNORMAL LOW (ref 39.0–52.0)
Hemoglobin: 9.8 g/dL — ABNORMAL LOW (ref 13.0–17.0)
MCH: 25.9 pg — ABNORMAL LOW (ref 26.0–34.0)
MCHC: 30.4 g/dL (ref 30.0–36.0)
MCV: 85 fL (ref 80.0–100.0)
Platelets: 360 10*3/uL (ref 150–400)
RBC: 3.79 MIL/uL — ABNORMAL LOW (ref 4.22–5.81)
RDW: 15.7 % — ABNORMAL HIGH (ref 11.5–15.5)
WBC: 11.3 10*3/uL — ABNORMAL HIGH (ref 4.0–10.5)
nRBC: 0 % (ref 0.0–0.2)

## 2019-06-29 LAB — GLUCOSE, CAPILLARY
Glucose-Capillary: 130 mg/dL — ABNORMAL HIGH (ref 70–99)
Glucose-Capillary: 158 mg/dL — ABNORMAL HIGH (ref 70–99)
Glucose-Capillary: 180 mg/dL — ABNORMAL HIGH (ref 70–99)
Glucose-Capillary: 147 mg/dL — ABNORMAL HIGH (ref 70–99)

## 2019-06-29 LAB — FERRITIN: Ferritin: 78 ng/mL (ref 24–336)

## 2019-06-29 LAB — AMMONIA: Ammonia: 10 umol/L (ref 9–35)

## 2019-06-29 LAB — C-REACTIVE PROTEIN: CRP: 8.3 mg/dL — ABNORMAL HIGH (ref <1.0)

## 2019-06-29 MED ORDER — HYDROCORTISONE 1 % EX CREA
TOPICAL_CREAM | Freq: Two times a day (BID) | CUTANEOUS | Status: DC | PRN
  Filled 2019-06-29: qty 28

## 2019-06-29 MED ORDER — DIPHENHYDRAMINE HCL 50 MG/ML IJ SOLN
25.0000 mg | Freq: Four times a day (QID) | INTRAMUSCULAR | Status: DC | PRN
  Administered 2019-06-29 – 2019-07-02 (×3): 25 mg via INTRAVENOUS
  Filled 2019-06-29 (×4): qty 1

## 2019-06-29 NOTE — Progress Notes (Signed)
PROGRESS NOTE  Blake Collins ZOX:[id number] DOB: November 19, 1934 DOA: 06/23/2019 PCP: Joette Catching, MD  Brief History: 85 y.o.malewith medical history ofdiabetes mellitus type 2, hypertension, hyperlipidemia, CKD stage III, chronic low back pain, GERD, anxiety presenting with 1 to 2-week history of generalized weakness. The patient has been complaining of some intermittent rectal bleeding for the better part of a week. He denies any hemoptysis, hematemesis, melena, constipation, diarrhea, abdominal pain. He has not had any fevers, chills, chest pain, cough, hemoptysis. The patient does complain of some intermittent shortness of breath over the past 2 to 3 days and worsening generalized weakness. He denies any recent travels, worsening lower extremity edema or increasing abdominal girth or orthopnea. As result, he presented for further evaluation. He denies any falling or syncope. He denies any new medications other than the fact that he received his first COVID-19 vaccination 3 days prior to this admission. He denies any dysuria, hematuria, headache, neck pain, flank pain. He quit smoking 20 years ago after approximately 60pack-year history. Notably, the patient states that his son with whom he lives has had some URI type symptoms. Apparently his son went to get a COVID-19 test, but the results are unknown at this time. In the emergency department, the patient was afebrile hemodynamically stable with oxygen saturation 86% on room air. He was placed on 2 L with oxygen saturation 95%. BMP showed a serum creatinine 1.85 which is above his usual baseline. LFTs were unremarkable. WBC 8.3, hemoglobin 10.0, platelets 277,000. Chest x-ray suggested possible hazy basilar opacities, cardiomegaly.Although initial POC SARS-CoV2 antigen was neg, his RT-PCR came back positive.  Assessment/Plan: Acute respiratory failure with hypoxiadue COVID-19 pneumonia -Likely multifactorial including  underlying COPD,OSA and COVID-19 -Currently stable on 2 L nasal cannula -Check procalcitonin0.20 -WJX914 -CRP20.8>>20.2>>>3.9 -Ferritin97>>115>>>80 -D-dimer--3.31>>3.17>>2.54 -EKG shows sinus rhythm, IVCD, nonspecific ST ST wave changes -SARS-CoV2 RT-PCR--positive -He completed his course of remdesivir on 3/31 -continue dexamethasone  Symptomatic anemia/rectal bleeding -Review of the medical record shows baseline hemoglobin11-12 -GI consultappreciated--no intervention until recovered from COVID unless becomes unstable -Clear liquid diet>>>advance -ContinuePPI -Type and screen -Hgb stable--minimal drop due to dilution  Acute on chronic renal failure--CKD stage IIIb -Baseline creatinine 1.2-1.5 -serum creatinine peaked 1.85 -Secondary to volume depletion -P.o. intake has been poor the last few days and creatinine is now trending up from 1.3-1.5. -Continue hydration -Holding lisinopril  Pyuria -concerned about UTI -start ceftriaxone pending culture data -urine culture <10K organisms -d/c ceftriaxone-->had 3 days  Cognitive impairment with delirium -likely has underlying dementia--daughter agrees  -Patient continues with worsening agitation, refusing care -He has had a decline over the past few days and is currently not at his baseline -He is receiving Haldol/Ativan as needed for agitation -Started on Seroquel nightly to help with sundowning -He may have a component of dehydration since his p.o. intake has been poor -Will check CT head to rule out any intracranial process -ammonia checked and was normal  Generalized weakness -Likely multifactorial includingCOVID 19,symptomatic anemia, acute on chronic renal failure -SerumB12--1289 -folate--15.1 -TSH--1.296 -PT evaluation-->SNF  Diabetes mellitus type 2 -Holding metformin and glipizide -NovoLog sliding scale -06/23/19 A1C--6.4  Essential hypertension -Holding lisinopril in the setting of soft blood  pressure and acute on chronic renal failure -BP remains well controlled  COPD -started combivent  Hyperlipidemia -Continue statin  Hypomagnesemia/hypophosphatemia -repleted  Fever -Patient noted to have low-grade fever 4/1. -This may be residual effect from Covid infection -urinalysis does not show signs of infection  Disposition Plan: Patient From: Home D/C Place: Skilled nursing facility placement once mental status has improved to baseline Barriers:  Worsening mental status and worsening p.o. intake  Family Communication:Daughter updated on phone 4/2  Consultants:GI  Code Status: FULL   DVT Prophylaxis: SCDs   Procedures: As Listed in Progress Note Above  Antibiotics: Ceftriaxone 3/28>>>3/30   Subjective: Recently medicated. Somnolent, does not appear agitated at this time   Objective: Vitals:   06/28/19 2036 06/28/19 2345 06/29/19 0520 06/29/19 1403  BP:  (!) 98/59 115/69 128/62  Pulse:  (!) 51 88 78  Resp:  18 18 18   Temp:  99 F (37.2 C) 99 F (37.2 C) 98 F (36.7 C)  TempSrc:  Oral Oral Axillary  SpO2: 91% 95% 93% 93%  Weight:      Height:        Intake/Output Summary (Last 24 hours) at 06/29/2019 1831 Last data filed at 06/29/2019 1600 Gross per 24 hour  Intake 1889.33 ml  Output --  Net 1889.33 ml   Weight change:  Exam:  General exam: sleeping, no distress Respiratory system: Clear to auscultation. Respiratory effort normal. Cardiovascular system:RRR. No murmurs, rubs, gallops. Gastrointestinal system: Abdomen is nondistended, soft and nontender. No organomegaly or masses felt. Normal bowel sounds heard. Central nervous system:  Limited exam due to mental status. No focal neurological deficits. Extremities: No C/C/E, +pedal pulses Skin: erythema related to MASD in groin bilaterally  Psychiatry: unable to assess due to mental status      Data Reviewed: I have personally reviewed following labs and  imaging studies Basic Metabolic Panel: Recent Labs  Lab 06/24/19 0615 06/24/19 0615 06/25/19 0907 06/26/19 0433 06/27/19 0600 06/28/19 0451 06/29/19 0607  NA 141   < > 142 144 145 152* 147*  K 4.2   < > 3.8 3.2* 3.3* 3.8 4.0  CL 108   < > 109 113* 112* 114* 114*  CO2 21*   < > 22 21* 23 23 25   GLUCOSE 163*   < > 142* 136* 132* 126* 152*  BUN 33*   < > 38* 40* 42* 45* 38*  CREATININE 1.49*   < > 1.48* 1.32* 1.32* 1.52* 1.25*  CALCIUM 8.9   < > 8.9 8.3* 8.5* 8.8* 8.3*  MG 1.6*  --  2.1  --   --   --   --   PHOS 2.2*  --  2.5  --   --   --   --    < > = values in this interval not displayed.   Liver Function Tests: Recent Labs  Lab 06/24/19 0615 06/25/19 0907 06/26/19 0433 06/27/19 0600 06/29/19 0607  AST 32 37 34 34 123*  ALT 15 21 21 25  66*  ALKPHOS 77 66 56 57 57  BILITOT 0.6 0.4 0.6 0.7 0.8  PROT 7.3 6.7 5.9* 6.2* 6.0*  ALBUMIN 2.7* 2.7* 2.4* 2.6* 2.5*   No results for input(s): LIPASE, AMYLASE in the last 168 hours. Recent Labs  Lab 06/29/19 0618  AMMONIA 10   Coagulation Profile: No results for input(s): INR, PROTIME in the last 168 hours. CBC: Recent Labs  Lab 06/23/19 1153 06/23/19 1153 06/24/19 0615 06/25/19 0907 06/26/19 0433 06/27/19 0600 06/29/19 0607  WBC 8.3   < > 6.1 10.3 8.5 8.9 11.3*  NEUTROABS 6.7  --  5.4 8.9* 7.1 7.3  --   HGB 10.0*   < > 10.4* 9.8* 9.1* 9.7* 9.8*  HCT 32.5*   < > 34.4*  31.6* 29.7* 31.6* 32.2*  MCV 84.2   < > 83.5 82.5 82.5 83.6 85.0  PLT 277   < > 343 382 407* 417* 360   < > = values in this interval not displayed.   Cardiac Enzymes: No results for input(s): CKTOTAL, CKMB, CKMBINDEX, TROPONINI in the last 168 hours. BNP: Invalid input(s): POCBNP CBG: Recent Labs  Lab 06/28/19 1647 06/28/19 2148 06/29/19 0826 06/29/19 1205 06/29/19 1812  GLUCAP 147* 133* 130* 158* 180*   HbA1C: No results for input(s): HGBA1C in the last 72 hours. Urine analysis:    Component Value Date/Time   COLORURINE YELLOW  06/28/2019 2045   APPEARANCEUR CLEAR 06/28/2019 2045   LABSPEC 1.018 06/28/2019 2045   PHURINE 5.0 06/28/2019 2045   GLUCOSEU NEGATIVE 06/28/2019 2045   HGBUR LARGE (A) 06/28/2019 2045   BILIRUBINUR NEGATIVE 06/28/2019 2045   KETONESUR 5 (A) 06/28/2019 2045   PROTEINUR 30 (A) 06/28/2019 2045   UROBILINOGEN 0.2 02/17/2011 1334   NITRITE NEGATIVE 06/28/2019 2045   LEUKOCYTESUR NEGATIVE 06/28/2019 2045   Sepsis Labs: @LABRCNTIP (procalcitonin:4,lacticidven:4) ) Recent Results (from the past 240 hour(s))  Respiratory Panel by RT PCR (Flu A&B, Covid) - Nasopharyngeal Swab     Status: Abnormal   Collection Time: 06/23/19 11:45 AM   Specimen: Nasopharyngeal Swab  Result Value Ref Range Status   SARS Coronavirus 2 by RT PCR POSITIVE (A) NEGATIVE Final    Comment: RESULT CALLED TO, READ BACK BY AND VERIFIED WITH: MARY @ 1518 ON [id number] BY HENDERSON L. (NOTE) SARS-CoV-2 target nucleic acids are DETECTED. SARS-CoV-2 RNA is generally detectable in upper respiratory specimens  during the acute phase of infection. Positive results are indicative of the presence of the identified virus, but do not rule out bacterial infection or co-infection with other pathogens not detected by the test. Clinical correlation with patient history and other diagnostic information is necessary to determine patient infection status. The expected result is Negative. Fact Sheet for Patients:  PinkCheek.be Fact Sheet for Healthcare Providers: GravelBags.it This test is not yet approved or cleared by the [place] FDA and  has been authorized for detection and/or diagnosis of SARS-CoV-2 by FDA under an Emergency Use Authorization (EUA).  This EUA will remain in effect (meaning this test can be used)  for the duration of  the COVID-19 declaration under Section 564(b)(1) of the Act, 21 U.S.C. section 360bbb-3(b)(1), unless the authorization is terminated or  revoked sooner.    Influenza A by PCR NEGATIVE NEGATIVE Final   Influenza B by PCR NEGATIVE NEGATIVE Final    Comment: (NOTE) The Xpert Xpress SARS-CoV-2/FLU/RSV assay is intended as an aid in  the diagnosis of influenza from Nasopharyngeal swab specimens and  should not be used as a sole basis for treatment. Nasal washings and  aspirates are unacceptable for Xpert Xpress SARS-CoV-2/FLU/RSV  testing. Fact Sheet for Patients: PinkCheek.be Fact Sheet for Healthcare Providers: GravelBags.it This test is not yet approved or cleared by the [place] FDA and  has been authorized for detection and/or diagnosis of SARS-CoV-2 by  FDA under an Emergency Use Authorization (EUA). This EUA will remain  in effect (meaning this test can be used) for the duration of the  Covid-19 declaration under Section 564(b)(1) of the Act, 21  U.S.C. section 360bbb-3(b)(1), unless the authorization is  terminated or revoked. Performed at [facility], [address], [place], [place] [address]   Urine Culture     Status: Abnormal   Collection Time: 06/23/19  4:00 PM  Specimen: Urine, Clean Catch  Result Value Ref Range Status   Specimen Description   Final    URINE, CLEAN CATCH Performed at [facility], [address], [place], [place] [address]    Special Requests   Final    NONE Performed at [facility], [address] [place], [place] [address]    Culture (A)  Final    <10,000 COLONIES/mL INSIGNIFICANT GROWTH Performed at [facility], [address], [place], [place] [address]    Report Status 06/25/2019 FINAL  Final     Scheduled Meds: . atorvastatin  20 mg Oral q morning - 10a  . dexamethasone (DECADRON) injection  6 mg Intravenous Daily  . insulin aspart  0-9 Units Subcutaneous TID WC  . pantoprazole  40 mg Oral Daily  . QUEtiapine  100 mg Oral QHS  . zinc sulfate  220 mg Oral Daily   Continuous Infusions: . dextrose 5 %  and 0.45 % NaCl with KCl 20 mEq/L 100 mL/hr at 06/29/19 1459    Procedures/Studies: CT HEAD WO CONTRAST  Result Date: 06/29/2019 CLINICAL DATA:  Encephalopathy. Additional history provided: Combative, restless, cognitive impairment with delirium, fever. COVID positive. History of diabetes mellitus, hypertension. EXAM: CT HEAD WITHOUT CONTRAST TECHNIQUE: Contiguous axial images were obtained from the base of the skull through the vertex without intravenous contrast. COMPARISON:  No pertinent prior studies available for comparison. FINDINGS: Brain: There is no evidence of acute intracranial hemorrhage or intracranial mass. No midline shift.No demarcated cortical infarction. A small hypodense extra-axial subdural collection is questioned overlying the right cerebral convexity, measuring up to 6 mm (series 4, image 27). Moderate generalized parenchymal atrophy. Vascular: No hyperdense vessel. Atherosclerotic calcifications Skull: Normal. Negative for fracture or focal lesion. Sinuses/Orbits: Visualized orbits demonstrate no acute abnormality. Mild ethmoid and right maxillary sinus mucosal thickening. Small left maxillary sinus mucous retention cyst. No significant mastoid effusion. Other: Small metallic foreign body within the scalp or possibly imbedded within the superficial aspect of the left frontal calvarium. IMPRESSION: No acute intracranial hemorrhage or demarcated cortical infarct. Questionable small hypodense extra-axial subdural collection measuring up to 6 mm in thickness overlying the right cerebral convexity. This could reflect a hygroma or chronic subdural hematoma. No midline shift. Moderate generalized parenchymal atrophy. Mild paranasal sinus mucosal thickening. Left maxillary sinus mucous retention cyst. Electronically Signed   By: Jackey Loge DO   On: 06/29/2019 18:08   DG Chest Port 1 View  Result Date: 06/23/2019 CLINICAL DATA:  Shortness of breath for 7 days EXAM: PORTABLE CHEST 1 VIEW  COMPARISON:  None. FINDINGS: Mediastinal contour is normal. Heart size is enlarged. There is mild hazy opacity of both lung bases. There is no pulmonary edema. No acute abnormalities identified in the visualized bony structures. IMPRESSION: Mild hazy opacity of both lung bases, developing pneumonia is not excluded. Electronically Signed   By: Sherian Rein M.D.   On: 06/23/2019 12:37    Erick Blinks, MD  [facility]  If 7PM-7AM, please contact night-coverage [email or web address]  06/29/2019, 6:31 PM   LOS: 6 days

## 2019-06-29 NOTE — Progress Notes (Signed)
Physical Therapy Treatment Patient Details Name: Blake Collins MRN: [id number] DOB: 08/02/1934 Today's Date: 06/29/2019    History of Present Illness Blake Collins is a 85 y.o. male with medical history of diabetes mellitus type 2, hypertension, hyperlipidemia, CKD stage III, chronic low back pain, GERD, anxiety presenting with 1 to 2-week history of generalized weakness.  The patient has been complaining of some intermittent rectal bleeding for the better part of a week.  He denies any hemoptysis, hematemesis, melena, constipation, diarrhea, abdominal pain.  He has not had any fevers, chills, chest pain, cough, hemoptysis.The patient does complain of some intermittent shortness of breath over the past 2 to 3 days and worsening generalized weakness.  He denies any recent travels, worsening lower extremity edema or increasing abdominal girth or orthopnea.  As result, he presented for further evaluation.  He denies any falling or syncope.  He denies any new medications other than the fact that he received his first COVID-19 vaccination 3 days prior to this admission.  He denies any dysuria, hematuria, headache, neck pain, flank pain.  He quit smoking 20 years ago after approximately 60 pack-year history.  Notably, the patient states that his son with whom he lives has had some URI type symptoms.  Apparently his son went to get a COVID-19 test, but the results are unknown at this time.    PT Comments    Patient presents confused with mittens to bilateral hands and requires Max verbal/tactile cueing to participate with therapy.  Patient demonstrates slow labored movement for sitting up at bedside, tendency to lean forward once sitting, attempted to stand without AD, but unable due to weakness and demonstrated poor carryover for attempting to use RW.  Patient tolerated rolling side to side in bed while linen changed and left in bed with mittens on and bed alarm set after therapy due to fall risk.  Patient will  benefit from continued physical therapy in hospital and recommended venue below to increase strength, balance, endurance for safe ADLs and gait.   Follow Up Recommendations  SNF;Supervision for mobility/OOB;Supervision/Assistance - 24 hour     Equipment Recommendations  None recommended by PT    Recommendations for Other Services       Precautions / Restrictions Precautions Precautions: Fall Restrictions Weight Bearing Restrictions: No    Mobility  Bed Mobility Overal bed mobility: Needs Assistance Bed Mobility: Rolling;Supine to Sit;Sit to Supine Rolling: Mod assist   Supine to sit: Mod assist Sit to supine: Mod assist   General bed mobility comments: requires repeated verbal/tacile cuing to follow directions with poor carryover due to confusion  Transfers                    Ambulation/Gait                 Stairs             Wheelchair Mobility    Modified Rankin (Stroke Patients Only)       Balance Overall balance assessment: Needs assistance Sitting-balance support: Feet supported;No upper extremity supported Sitting balance-Leahy Scale: Fair Sitting balance - Comments: seated at EOB                                    Cognition Arousal/Alertness: Awake/alert Behavior During Therapy: Flat affect;Impulsive;Agitated;Restless Overall Cognitive Status: Impaired/Different from baseline Area of Impairment: Attention;Following commands;Awareness;Problem solving  Current Attention Level: Selective;Alternating   Following Commands: Follows one step commands inconsistently     Problem Solving: Decreased initiation;Requires verbal cues;Requires tactile cues        Exercises      General Comments        Pertinent Vitals/Pain Pain Assessment: No/denies pain    Home Living                      Prior Function            PT Goals (current goals can now be found in the care plan  section) Acute Rehab PT Goals Patient Stated Goal: return home PT Goal Formulation: With patient Time For Goal Achievement: 07/08/19 Potential to Achieve Goals: Fair Progress towards PT goals: Progressing toward goals    Frequency    Min 3X/week      PT Plan Current plan remains appropriate    Co-evaluation              AM-PAC PT "6 Clicks" Mobility   Outcome Measure  Help needed turning from your back to your side while in a flat bed without using bedrails?: A Lot Help needed moving from lying on your back to sitting on the side of a flat bed without using bedrails?: A Lot Help needed moving to and from a bed to a chair (including a wheelchair)?: Total Help needed standing up from a chair using your arms (e.g., wheelchair or bedside chair)?: Total Help needed to walk in hospital room?: Total Help needed climbing 3-5 steps with a railing? : Total 6 Click Score: 8    End of Session   Activity Tolerance: Patient tolerated treatment well;Patient limited by fatigue;Treatment limited secondary to agitation Patient left: in bed;with call bell/phone within reach;with bed alarm set Nurse Communication: Mobility status PT Visit Diagnosis: Unsteadiness on feet (R26.81);Other abnormalities of gait and mobility (R26.89);Muscle weakness (generalized) (M62.81)     Time: 1517-6160 PT Time Calculation (min) (ACUTE ONLY): 28 min  Charges:  $Therapeutic Activity: 23-37 mins                     2:08 PM, 06/29/19 Ocie Bob, MPT Physical Therapist with [facility] [phone] office [phone] mobile phone

## 2019-06-30 LAB — C-REACTIVE PROTEIN: CRP: 4.1 mg/dL — ABNORMAL HIGH (ref <1.0)

## 2019-06-30 LAB — GLUCOSE, CAPILLARY
Glucose-Capillary: 130 mg/dL — ABNORMAL HIGH (ref 70–99)
Glucose-Capillary: 133 mg/dL — ABNORMAL HIGH (ref 70–99)
Glucose-Capillary: 143 mg/dL — ABNORMAL HIGH (ref 70–99)
Glucose-Capillary: 196 mg/dL — ABNORMAL HIGH (ref 70–99)

## 2019-06-30 LAB — BLOOD GAS, ARTERIAL
Acid-Base Excess: 1 mmol/L (ref 0.0–2.0)
Bicarbonate: 25.2 mmol/L (ref 20.0–28.0)
FIO2: 28
O2 Saturation: 91 %
Patient temperature: 37
pCO2 arterial: 40.7 mmHg (ref 32.0–48.0)
pH, Arterial: 7.408 (ref 7.350–7.450)
pO2, Arterial: 65 mmHg — ABNORMAL LOW (ref 83.0–108.0)

## 2019-06-30 LAB — BASIC METABOLIC PANEL
Anion gap: 9 (ref 5–15)
BUN: 29 mg/dL — ABNORMAL HIGH (ref 8–23)
CO2: 26 mmol/L (ref 22–32)
Calcium: 8.5 mg/dL — ABNORMAL LOW (ref 8.9–10.3)
Chloride: 111 mmol/L (ref 98–111)
Creatinine, Ser: 1.04 mg/dL (ref 0.61–1.24)
GFR calc Af Amer: >60 mL/min (ref >60)
GFR calc non Af Amer: >60 mL/min (ref >60)
Glucose, Bld: 153 mg/dL — ABNORMAL HIGH (ref 70–99)
Potassium: 4.2 mmol/L (ref 3.5–5.1)
Sodium: 146 mmol/L — ABNORMAL HIGH (ref 135–145)

## 2019-06-30 LAB — CBC
HCT: 35.4 % — ABNORMAL LOW (ref 39.0–52.0)
Hemoglobin: 10.4 g/dL — ABNORMAL LOW (ref 13.0–17.0)
MCH: 25.2 pg — ABNORMAL LOW (ref 26.0–34.0)
MCHC: 29.4 g/dL — ABNORMAL LOW (ref 30.0–36.0)
MCV: 85.9 fL (ref 80.0–100.0)
Platelets: 353 10*3/uL (ref 150–400)
RBC: 4.12 MIL/uL — ABNORMAL LOW (ref 4.22–5.81)
RDW: 15.4 % (ref 11.5–15.5)
WBC: 11.6 10*3/uL — ABNORMAL HIGH (ref 4.0–10.5)
nRBC: 0 % (ref 0.0–0.2)

## 2019-06-30 LAB — URINE CULTURE: Culture: NO GROWTH

## 2019-06-30 LAB — FERRITIN: Ferritin: 70 ng/mL (ref 24–336)

## 2019-06-30 LAB — D-DIMER, QUANTITATIVE: D-Dimer, Quant: 3.15 ug/mL-FEU — ABNORMAL HIGH (ref 0.00–0.50)

## 2019-06-30 MED ORDER — ZINC OXIDE 40 % EX OINT
TOPICAL_OINTMENT | Freq: Every day | CUTANEOUS | Status: DC | PRN
  Filled 2019-06-30: qty 57

## 2019-06-30 NOTE — Progress Notes (Signed)
PROGRESS NOTE  Blake Collins:[id number] DOB: 08/27/1934 DOA: 06/23/2019 PCP: Joette CatchingNyland, [name], MD  Brief History: 85 y.o.malewith medical history ofdiabetes mellitus type 2, hypertension, hyperlipidemia, CKD stage III, chronic low back pain, GERD, anxiety presenting with 1 to 2-week history of generalized weakness. The patient has been complaining of some intermittent rectal bleeding for the better part of a week. He denies any hemoptysis, hematemesis, melena, constipation, diarrhea, abdominal pain. He has not had any fevers, chills, chest pain, cough, hemoptysis. The patient does complain of some intermittent shortness of breath over the past 2 to 3 days and worsening generalized weakness. He denies any recent travels, worsening lower extremity edema or increasing abdominal girth or orthopnea. As result, he presented for further evaluation. He denies any falling or syncope. He denies any new medications other than the fact that he received his first COVID-19 vaccination 3 days prior to this admission. He denies any dysuria, hematuria, headache, neck pain, flank pain. He quit smoking 20 years ago after approximately 60pack-year history. Notably, the patient states that his son with whom he lives has had some URI type symptoms. Apparently his son went to get a COVID-19 test, but the results are unknown at this time. In the emergency department, the patient was afebrile hemodynamically stable with oxygen saturation 86% on room air. He was placed on 2 L with oxygen saturation 95%. BMP showed a serum creatinine 1.85 which is above his usual baseline. LFTs were unremarkable. WBC 8.3, hemoglobin 10.0, platelets 277,000. Chest x-ray suggested possible hazy basilar opacities, cardiomegaly.Although initial POC SARS-CoV2 antigen was neg, his RT-PCR came back positive.  Assessment/Plan: Acute respiratory failure with hypoxiadue COVID-19 pneumonia -Likely multifactorial including  underlying COPD,OSA and COVID-19 -Currently stable on 2 L nasal cannula -Check procalcitonin0.20 -[id number] -CRP20.8>>20.2>>>3.9 -Ferritin97>>115>>>80 -D-dimer--3.31>>3.17>>2.54 -EKG shows sinus rhythm, IVCD, nonspecific ST ST wave changes -SARS-CoV2 RT-PCR--positive -He completed his course of remdesivir on 3/31 -will discontinue dexamethasone to see if this contributing to encephalopathy  Symptomatic anemia/rectal bleeding -Review of the medical record shows baseline hemoglobin11-12 -GI consultappreciated--no intervention until recovered from COVID unless becomes unstable -Clear liquid diet>>>advance -ContinuePPI -Type and screen -Hgb stable--minimal drop due to dilution  Acute on chronic renal failure--CKD stage IIIb -Baseline creatinine 1.2-1.5 -serum creatinine peaked 1.85 -Secondary to volume depletion -P.o. intake has been poor the last few days and creatinine began to trend up -creatinine is now normal with hydration -Holding lisinopril  Pyuria -concerned about UTI -start ceftriaxone pending culture data -urine culture <10K organisms -d/c ceftriaxone-->had 3 days  Cognitive impairment with delirium -possibly has underlying dementia--daughter agrees  -Patient was very functional prior to admission (driving, paying bills) -Patient continues with worsening agitation, refusing care -He has had a decline over the past few days and is currently not at his baseline -He is receiving Haldol/Ativan as needed for agitation -Started on Seroquel nightly to help with sundowning -CT head did not show any acute process -ammonia checked and was normal -check ABG and EEG  Generalized weakness -Likely multifactorial includingCOVID 19,symptomatic anemia, acute on chronic renal failure -SerumB12--1289 -folate--15.1 -TSH--1.296 -PT evaluation-->SNF  Diabetes mellitus type 2 -Holding metformin and glipizide -NovoLog sliding scale -06/23/19 A1C--6.4  Essential  hypertension -Holding lisinopril in the setting of soft blood pressure and acute on chronic renal failure -BP remains well controlled  COPD -started combivent  Hyperlipidemia -Continue statin  Hypomagnesemia/hypophosphatemia -repleted   Disposition Plan: Patient From: Home D/C Place: Skilled nursing facility placement once mental status  has improved to baseline Barriers:  Worsening mental status and worsening p.o. intake  Family Communication:Daughter updated on phone 4/2  Consultants:GI  Code Status: FULL   DVT Prophylaxis: SCDs   Procedures: As Listed in Progress Note Above  Antibiotics: Ceftriaxone 3/28>>>3/30   Subjective: Patient remains agitated and received ativan earlier. He is somnolent at this time  Objective: Vitals:   06/29/19 1403 06/29/19 2127 06/29/19 2200 06/30/19 0542  BP: 128/62  (!) 147/89 (!) 143/56  Pulse: 78  98 61  Resp: 18  (!) 22 (!) 22  Temp: 98 F (36.7 C)  98.8 F (37.1 C) 97.6 F (36.4 C)  TempSrc: Axillary  Axillary Axillary  SpO2: 93% 93% 90%   Weight:      Height:        Intake/Output Summary (Last 24 hours) at 06/30/2019 1647 Last data filed at 06/30/2019 0800 Gross per 24 hour  Intake 0 ml  Output --  Net 0 ml   Weight change:  Exam:  General exam: somnolent, no distress Respiratory system: Clear to auscultation. Respiratory effort normal. Cardiovascular system:RRR. No murmurs, rubs, gallops. Gastrointestinal system: Abdomen is nondistended, soft and nontender. No organomegaly or masses felt. Normal bowel sounds heard. Central nervous system: No focal neurological deficits. Extremities: No C/C/E, +pedal pulses Skin: No rashes, lesions or ulcers Psychiatry: unable to assess due to mental status.    Data Reviewed: I have personally reviewed following labs and imaging studies Basic Metabolic Panel: Recent Labs  Lab 06/24/19 0615 06/24/19 0615 06/25/19 0907 06/25/19 0907 06/26/19 0433  06/27/19 0600 06/28/19 0451 06/29/19 0607 06/30/19 0649  NA 141   < > 142   < > 144 145 152* 147* 146*  K 4.2   < > 3.8   < > 3.2* 3.3* 3.8 4.0 4.2  CL 108   < > 109   < > 113* 112* 114* 114* 111  CO2 21*   < > 22   < > 21* 23 23 25 26   GLUCOSE 163*   < > 142*   < > 136* 132* 126* 152* 153*  BUN 33*   < > 38*   < > 40* 42* 45* 38* 29*  CREATININE 1.49*   < > 1.48*   < > 1.32* 1.32* 1.52* 1.25* 1.04  CALCIUM 8.9   < > 8.9   < > 8.3* 8.5* 8.8* 8.3* 8.5*  MG 1.6*  --  2.1  --   --   --   --   --   --   PHOS 2.2*  --  2.5  --   --   --   --   --   --    < > = values in this interval not displayed.   Liver Function Tests: Recent Labs  Lab 06/24/19 0615 06/25/19 0907 06/26/19 0433 06/27/19 0600 06/29/19 0607  AST 32 37 34 34 123*  ALT 15 21 21 25  66*  ALKPHOS 77 66 56 57 57  BILITOT 0.6 0.4 0.6 0.7 0.8  PROT 7.3 6.7 5.9* 6.2* 6.0*  ALBUMIN 2.7* 2.7* 2.4* 2.6* 2.5*   No results for input(s): LIPASE, AMYLASE in the last 168 hours. Recent Labs  Lab 06/29/19 0618  AMMONIA 10   Coagulation Profile: No results for input(s): INR, PROTIME in the last 168 hours. CBC: Recent Labs  Lab 06/24/19 0615 06/24/19 0615 06/25/19 0907 06/26/19 0433 06/27/19 0600 06/29/19 0607 06/30/19 0649  WBC 6.1   < > 10.3 8.5 8.9 11.3*  11.6*  NEUTROABS 5.4  --  8.9* 7.1 7.3  --   --   HGB 10.4*   < > 9.8* 9.1* 9.7* 9.8* 10.4*  HCT 34.4*   < > 31.6* 29.7* 31.6* 32.2* 35.4*  MCV 83.5   < > 82.5 82.5 83.6 85.0 85.9  PLT 343   < > 382 407* 417* 360 353   < > = values in this interval not displayed.   Cardiac Enzymes: No results for input(s): CKTOTAL, CKMB, CKMBINDEX, TROPONINI in the last 168 hours. BNP: Invalid input(s): POCBNP CBG: Recent Labs  Lab 06/29/19 1205 06/29/19 1812 06/29/19 2146 06/30/19 0801 06/30/19 1223  GLUCAP 158* 180* 147* 130* 143*   HbA1C: No results for input(s): HGBA1C in the last 72 hours. Urine analysis:    Component Value Date/Time   COLORURINE YELLOW  06/28/2019 2045   APPEARANCEUR CLEAR 06/28/2019 2045   LABSPEC 1.018 06/28/2019 2045   PHURINE 5.0 06/28/2019 2045   GLUCOSEU NEGATIVE 06/28/2019 2045   HGBUR LARGE (A) 06/28/2019 2045   BILIRUBINUR NEGATIVE 06/28/2019 2045   KETONESUR 5 (A) 06/28/2019 2045   PROTEINUR 30 (A) 06/28/2019 2045   UROBILINOGEN 0.2 02/17/2011 1334   NITRITE NEGATIVE 06/28/2019 2045   LEUKOCYTESUR NEGATIVE 06/28/2019 2045   Sepsis Labs: @LABRCNTIP (procalcitonin:4,lacticidven:4) ) Recent Results (from the past 240 hour(s))  Respiratory Panel by RT PCR (Flu A&B, Covid) - Nasopharyngeal Swab     Status: Abnormal   Collection Time: 06/23/19 11:45 AM   Specimen: Nasopharyngeal Swab  Result Value Ref Range Status   SARS Coronavirus 2 by RT PCR POSITIVE (A) NEGATIVE Final    Comment: RESULT CALLED TO, READ BACK BY AND VERIFIED WITH: MARY @ 1518 ON [id number] BY HENDERSON L. (NOTE) SARS-CoV-2 target nucleic acids are DETECTED. SARS-CoV-2 RNA is generally detectable in upper respiratory specimens  during the acute phase of infection. Positive results are indicative of the presence of the identified virus, but do not rule out bacterial infection or co-infection with other pathogens not detected by the test. Clinical correlation with patient history and other diagnostic information is necessary to determine patient infection status. The expected result is Negative. Fact Sheet for Patients:  PinkCheek.be Fact Sheet for Healthcare Providers: GravelBags.it This test is not yet approved or cleared by the [place] FDA and  has been authorized for detection and/or diagnosis of SARS-CoV-2 by FDA under an Emergency Use Authorization (EUA).  This EUA will remain in effect (meaning this test can be used)  for the duration of  the COVID-19 declaration under Section 564(b)(1) of the Act, 21 U.S.C. section 360bbb-3(b)(1), unless the authorization is terminated or  revoked sooner.    Influenza A by PCR NEGATIVE NEGATIVE Final   Influenza B by PCR NEGATIVE NEGATIVE Final    Comment: (NOTE) The Xpert Xpress SARS-CoV-2/FLU/RSV assay is intended as an aid in  the diagnosis of influenza from Nasopharyngeal swab specimens and  should not be used as a sole basis for treatment. Nasal washings and  aspirates are unacceptable for Xpert Xpress SARS-CoV-2/FLU/RSV  testing. Fact Sheet for Patients: PinkCheek.be Fact Sheet for Healthcare Providers: GravelBags.it This test is not yet approved or cleared by the [place] FDA and  has been authorized for detection and/or diagnosis of SARS-CoV-2 by  FDA under an Emergency Use Authorization (EUA). This EUA will remain  in effect (meaning this test can be used) for the duration of the  Covid-19 declaration under Section 564(b)(1) of the Act, 21  U.S.C. section 360bbb-3(b)(1), unless  the authorization is  terminated or revoked. Performed at [facility] [place] [facility], [address] [place], [place] [address]   Urine Culture     Status: Abnormal   Collection Time: 06/23/19  4:00 PM   Specimen: Urine, Clean Catch  Result Value Ref Range Status   Specimen Description   Final    URINE, CLEAN CATCH Performed at [facility], [address], [place], [place] [address]    Special Requests   Final    NONE Performed at [facility], [address] [place], [place] [address]    Culture (A)  Final    <10,000 COLONIES/mL INSIGNIFICANT GROWTH Performed at [facility], [address] [place], [place] [address]    Report Status 06/25/2019 FINAL  Final  Culture, Urine     Status: None   Collection Time: 06/28/19  8:45 PM   Specimen: Urine, Catheterized  Result Value Ref Range Status   Specimen Description   Final    URINE, CATHETERIZED Performed at [facility], [address] [place], [place] [address]    Special Requests   Final    NONE Performed at [facility], [address], [place], [place] [address]    Culture   Final    NO GROWTH Performed at [facility], [address] [place], [place] [address]    Report Status 06/30/2019 FINAL  Final     Scheduled Meds: . atorvastatin  20 mg Oral q morning - 10a  . dexamethasone (DECADRON) injection  6 mg Intravenous Daily  . insulin aspart  0-9 Units Subcutaneous TID WC  . pantoprazole  40 mg Oral Daily  . QUEtiapine  100 mg Oral QHS  . zinc sulfate  220 mg Oral Daily   Continuous Infusions: . dextrose 5 % and 0.45 % NaCl with KCl 20 mEq/L 100 mL/hr at 06/30/19 1227    Procedures/Studies: CT HEAD WO CONTRAST  Result Date: 06/29/2019 CLINICAL DATA:  Encephalopathy. Additional history provided: Combative, restless, cognitive impairment with delirium, fever. COVID positive. History of diabetes mellitus, hypertension. EXAM: CT HEAD WITHOUT CONTRAST TECHNIQUE: Contiguous axial images were obtained from the base of the skull through the vertex without intravenous contrast. COMPARISON:  No pertinent prior studies available for comparison. FINDINGS: Brain: There is no evidence of acute intracranial hemorrhage or intracranial mass. No midline shift.No demarcated cortical infarction. A small hypodense extra-axial subdural collection is questioned overlying the right cerebral convexity, measuring up to 6 mm (series 4, image 27). Moderate generalized parenchymal atrophy. Vascular: No hyperdense vessel. Atherosclerotic calcifications Skull: Normal. Negative for fracture or focal lesion. Sinuses/Orbits: Visualized orbits demonstrate no acute abnormality. Mild ethmoid and right maxillary sinus mucosal thickening. Small left maxillary sinus mucous retention cyst. No significant mastoid effusion. Other: Small metallic foreign body within the scalp or possibly imbedded within the superficial aspect of the left frontal calvarium. IMPRESSION: No acute intracranial hemorrhage or demarcated cortical infarct.  Questionable small hypodense extra-axial subdural collection measuring up to 6 mm in thickness overlying the right cerebral convexity. This could reflect a hygroma or chronic subdural hematoma. No midline shift. Moderate generalized parenchymal atrophy. Mild paranasal sinus mucosal thickening. Left maxillary sinus mucous retention cyst. Electronically Signed   By: Jackey Loge DO   On: 06/29/2019 18:08   DG Chest Port 1 View  Result Date: 06/23/2019 CLINICAL DATA:  Shortness of breath for 7 days EXAM: PORTABLE CHEST 1 VIEW COMPARISON:  None. FINDINGS: Mediastinal contour is normal. Heart size is enlarged. There is  mild hazy opacity of both lung bases. There is no pulmonary edema. No acute abnormalities identified in the visualized bony structures. IMPRESSION: Mild hazy opacity of both lung bases, developing pneumonia is not excluded. Electronically Signed   By: Sherian Rein M.D.   On: 06/23/2019 12:37    Erick Blinks, MD  [facility]  If 7PM-7AM, please contact night-coverage [email or web address]  06/30/2019, 4:47 PM   LOS: 7 days

## 2019-07-01 ENCOUNTER — Inpatient Hospital Stay (HOSPITAL_COMMUNITY): Payer: Medicare HMO

## 2019-07-01 ENCOUNTER — Inpatient Hospital Stay: Payer: Self-pay

## 2019-07-01 LAB — BASIC METABOLIC PANEL
Anion gap: 10 (ref 5–15)
BUN: 25 mg/dL — ABNORMAL HIGH (ref 8–23)
CO2: 24 mmol/L (ref 22–32)
Calcium: 8.4 mg/dL — ABNORMAL LOW (ref 8.9–10.3)
Chloride: 108 mmol/L (ref 98–111)
Creatinine, Ser: 1.08 mg/dL (ref 0.61–1.24)
GFR calc Af Amer: >60 mL/min (ref >60)
GFR calc non Af Amer: >60 mL/min (ref >60)
Glucose, Bld: 128 mg/dL — ABNORMAL HIGH (ref 70–99)
Potassium: 3.9 mmol/L (ref 3.5–5.1)
Sodium: 142 mmol/L (ref 135–145)

## 2019-07-01 LAB — CBC
HCT: 37.2 % — ABNORMAL LOW (ref 39.0–52.0)
Hemoglobin: 11 g/dL — ABNORMAL LOW (ref 13.0–17.0)
MCH: 25.2 pg — ABNORMAL LOW (ref 26.0–34.0)
MCHC: 29.6 g/dL — ABNORMAL LOW (ref 30.0–36.0)
MCV: 85.3 fL (ref 80.0–100.0)
Platelets: 302 10*3/uL (ref 150–400)
RBC: 4.36 MIL/uL (ref 4.22–5.81)
RDW: 15.4 % (ref 11.5–15.5)
WBC: 12.3 10*3/uL — ABNORMAL HIGH (ref 4.0–10.5)
nRBC: 0 % (ref 0.0–0.2)

## 2019-07-01 LAB — GLUCOSE, CAPILLARY
Glucose-Capillary: 124 mg/dL — ABNORMAL HIGH (ref 70–99)
Glucose-Capillary: 121 mg/dL — ABNORMAL HIGH (ref 70–99)
Glucose-Capillary: 136 mg/dL — ABNORMAL HIGH (ref 70–99)
Glucose-Capillary: 100 mg/dL — ABNORMAL HIGH (ref 70–99)

## 2019-07-01 LAB — D-DIMER, QUANTITATIVE: D-Dimer, Quant: 9.26 ug/mL-FEU — ABNORMAL HIGH (ref 0.00–0.50)

## 2019-07-01 LAB — C-REACTIVE PROTEIN: CRP: 1.7 mg/dL — ABNORMAL HIGH (ref <1.0)

## 2019-07-01 LAB — FERRITIN: Ferritin: 68 ng/mL (ref 24–336)

## 2019-07-01 MED ORDER — IOHEXOL 350 MG/ML SOLN
100.0000 mL | Freq: Once | INTRAVENOUS | Status: AC | PRN
  Administered 2019-07-02: 100 mL via INTRAVENOUS

## 2019-07-01 MED ORDER — ENOXAPARIN SODIUM 100 MG/ML ~~LOC~~ SOLN
100.0000 mg | Freq: Two times a day (BID) | SUBCUTANEOUS | Status: DC
  Administered 2019-07-01 – 2019-07-07 (×12): 100 mg via SUBCUTANEOUS
  Filled 2019-07-01 (×12): qty 1

## 2019-07-01 NOTE — Progress Notes (Signed)
[name]           PROGRESS NOTE  [name] VFI:[id number] DOB: 01-16-1935 DOA: 06/23/2019 PCP: Blake Catching, MD  Brief History: 85 y.o.malewith medical history ofdiabetes mellitus type 2, hypertension, hyperlipidemia, CKD stage III, chronic low back pain, GERD, anxiety presenting with 1 to 2-week history of generalized weakness. The patient has been complaining of some intermittent rectal bleeding for the better part of a week. He denies any hemoptysis, hematemesis, melena, constipation, diarrhea, abdominal pain. He has not had any fevers, chills, chest pain, cough, hemoptysis. The patient does complain of some intermittent shortness of breath over the past 2 to 3 days and worsening generalized weakness. He denies any recent travels, worsening lower extremity edema or increasing abdominal girth or orthopnea. As result, he presented for further evaluation. He denies any falling or syncope. He denies any new medications other than the fact that he received his first COVID-19 vaccination 3 days prior to this admission. He denies any dysuria, hematuria, headache, neck pain, flank pain. He quit smoking 20 years ago after approximately 60pack-year history. Notably, the patient states that his son with whom he lives has had some URI type symptoms. Apparently his son went to get a COVID-19 test, but the results are unknown at this time. In the emergency department, the patient was afebrile hemodynamically stable with oxygen saturation 86% on room air. He was placed on 2 L with oxygen saturation 95%. BMP showed a serum creatinine 1.85 which is above his usual baseline. LFTs were unremarkable. WBC 8.3, hemoglobin 10.0, platelets 277,000. Chest x-ray suggested possible hazy basilar opacities, cardiomegaly.Although initial POC SARS-CoV2 antigen was neg, his RT-PCR came back positive.  Assessment/Plan: Acute respiratory failure with hypoxiadue COVID-19 pneumonia -Likely multifactorial  including underlying COPD,OSA and COVID-19 -Currently stable on 2 L nasal cannula -Check procalcitonin0.20 -CZY606 -CRP20.8>>20.2>>>3.9 -Ferritin97>>115>>>80 -D-dimer--3.31>>3.17>>2.54 -EKG shows sinus rhythm, IVCD, nonspecific ST ST wave changes -SARS-CoV2 RT-PCR--positive -He completed his course of remdesivir on 3/31  Symptomatic anemia/rectal bleeding -Review of the medical record shows baseline hemoglobin11-12 -GI consultappreciated--no intervention until recovered from COVID unless becomes unstable -Clear liquid diet>>>advance -ContinuePPI -Type and screen -Hgb stable--minimal drop due to dilution  Acute on chronic renal failure--CKD stage IIIb -Baseline creatinine 1.2-1.5 -serum creatinine peaked 1.85 -Secondary to volume depletion -P.o. intake has been poor the last few days and creatinine began to trend up -creatinine is now normal with hydration -Holding lisinopril  Pyuria -concerned about UTI -start ceftriaxone pending culture data -urine culture <10K organisms -d/c ceftriaxone-->had 3 days  Cognitive impairment with delirium -possibly has underlying dementia--daughter agrees  -Patient was very functional prior to admission (driving, paying bills) -Patient continues with worsening agitation, refusing care -He has had a decline over the past few days and is currently not at his baseline -He is receiving Haldol/Ativan as needed for agitation -Started on Seroquel nightly to help with sundowning -CT head did not show any acute process -ammonia checked and was normal -ABG does not show elevated PCO2 -EEG has been ordered.  Generalized weakness -Likely multifactorial includingCOVID 19,symptomatic anemia, acute on chronic renal failure -SerumB12--1289 -folate--15.1 -TSH--1.296 -PT evaluation-->SNF  Diabetes mellitus type 2 -Holding metformin and glipizide -NovoLog sliding scale -06/23/19 A1C--6.4  Essential hypertension -Holding lisinopril  in the setting of soft blood pressure and acute on chronic renal failure -BP remains well controlled  COPD -started combivent  Hyperlipidemia -Continue statin  Hypomagnesemia/hypophosphatemia -repleted  Elevated D-dimer -D-dimer has significantly elevated up to 9.26 today. -We will check venous Dopplers to rule  out DVT -No signs of recent bleeding, will start on full dose Lovenox until VTE has been ruled out -Check CT angio chest to rule out PE   Disposition Plan: Patient From: Home D/C Place: Skilled nursing facility placement once mental status has improved to baseline Barriers:  Worsening mental status and worsening p.o. intake  Family Communication:Daughter updated on phone 4/4  Consultants:GI  Code Status: FULL   DVT Prophylaxis: Lovenox   Procedures: As Listed in Progress Note Above  Antibiotics: Ceftriaxone 3/28>>>3/30   Subjective: Patient is lying in bed, he is calm, he is awake.  Cannot make out speech.  Objective: Vitals:   06/29/19 2200 06/30/19 0542 06/30/19 1712 07/01/19 1802  BP: (!) 147/89 (!) 143/56 126/66 129/75  Pulse: 98 61 (!) 59 86  Resp: (!) 22 (!) 22 16 18   Temp: 98.8 F (37.1 C) 97.6 F (36.4 C) 98.6 F (37 C) 98.8 F (37.1 C)  TempSrc: Axillary Axillary Oral Oral  SpO2: 90%  94% 92%  Weight:      Height:       No intake or output data in the 24 hours ending 07/01/19 1818 Weight change:  Exam:  General exam: Awake, calm, no distress Respiratory system: Clear to auscultation. Respiratory effort normal. Cardiovascular system:RRR. No murmurs, rubs, gallops. Gastrointestinal system: Abdomen is nondistended, soft and nontender. No organomegaly or masses felt. Normal bowel sounds heard. Central nervous system: No focal neurological deficits. Extremities: No C/C/E, +pedal pulses Skin: No rashes, lesions or ulcers Psychiatry: Cannot assess due to mental status.  Speech is difficult to comprehend   Data  Reviewed: I have personally reviewed following labs and imaging studies Basic Metabolic Panel: Recent Labs  Lab 06/25/19 0907 06/26/19 0433 06/27/19 0600 06/28/19 0451 06/29/19 0607 06/30/19 0649 07/01/19 0616  NA 142   < > 145 152* 147* 146* 142  K 3.8   < > 3.3* 3.8 4.0 4.2 3.9  CL 109   < > 112* 114* 114* 111 108  CO2 22   < > 23 23 25 26 24   GLUCOSE 142*   < > 132* 126* 152* 153* 128*  BUN 38*   < > 42* 45* 38* 29* 25*  CREATININE 1.48*   < > 1.32* 1.52* 1.25* 1.04 1.08  CALCIUM 8.9   < > 8.5* 8.8* 8.3* 8.5* 8.4*  MG 2.1  --   --   --   --   --   --   PHOS 2.5  --   --   --   --   --   --    < > = values in this interval not displayed.   Liver Function Tests: Recent Labs  Lab 06/25/19 0907 06/26/19 0433 06/27/19 0600 06/29/19 0607  AST 37 34 34 123*  ALT 21 21 25  66*  ALKPHOS 66 56 57 57  BILITOT 0.4 0.6 0.7 0.8  PROT 6.7 5.9* 6.2* 6.0*  ALBUMIN 2.7* 2.4* 2.6* 2.5*   No results for input(s): LIPASE, AMYLASE in the last 168 hours. Recent Labs  Lab 06/29/19 0618  AMMONIA 10   Coagulation Profile: No results for input(s): INR, PROTIME in the last 168 hours. CBC: Recent Labs  Lab 06/25/19 0907 06/25/19 0907 06/26/19 0433 06/27/19 0600 06/29/19 0607 06/30/19 0649 07/01/19 0616  WBC 10.3   < > 8.5 8.9 11.3* 11.6* 12.3*  NEUTROABS 8.9*  --  7.1 7.3  --   --   --   HGB 9.8*   < >  9.1* 9.7* 9.8* 10.4* 11.0*  HCT 31.6*   < > 29.7* 31.6* 32.2* 35.4* 37.2*  MCV 82.5   < > 82.5 83.6 85.0 85.9 85.3  PLT 382   < > 407* 417* 360 353 302   < > = values in this interval not displayed.   Cardiac Enzymes: No results for input(s): CKTOTAL, CKMB, CKMBINDEX, TROPONINI in the last 168 hours. BNP: Invalid input(s): POCBNP CBG: Recent Labs  Lab 06/30/19 1706 06/30/19 2357 07/01/19 0742 07/01/19 1211 07/01/19 1759  GLUCAP 196* 133* 124* 121* 136*   HbA1C: No results for input(s): HGBA1C in the last 72 hours. Urine analysis:    Component Value Date/Time    COLORURINE YELLOW 06/28/2019 2045   APPEARANCEUR CLEAR 06/28/2019 2045   LABSPEC 1.018 06/28/2019 2045   PHURINE 5.0 06/28/2019 2045   GLUCOSEU NEGATIVE 06/28/2019 2045   HGBUR LARGE (A) 06/28/2019 2045   BILIRUBINUR NEGATIVE 06/28/2019 2045   KETONESUR 5 (A) 06/28/2019 2045   PROTEINUR 30 (A) 06/28/2019 2045   UROBILINOGEN 0.2 02/17/2011 1334   NITRITE NEGATIVE 06/28/2019 2045   LEUKOCYTESUR NEGATIVE 06/28/2019 2045   Sepsis Labs: @LABRCNTIP (procalcitonin:4,lacticidven:4) ) Recent Results (from the past 240 hour(s))  Respiratory Panel by RT PCR (Flu A&B, Covid) - Nasopharyngeal Swab     Status: Abnormal   Collection Time: 06/23/19 11:45 AM   Specimen: Nasopharyngeal Swab  Result Value Ref Range Status   SARS Coronavirus 2 by RT PCR POSITIVE (A) NEGATIVE Final    Comment: RESULT CALLED TO, READ BACK BY AND VERIFIED WITH: MARY @ 1518 ON 06/25/19 BY HENDERSON L. (NOTE) SARS-CoV-2 target nucleic acids are DETECTED. SARS-CoV-2 RNA is generally detectable in upper respiratory specimens  during the acute phase of infection. Positive results are indicative of the presence of the identified virus, but do not rule out bacterial infection or co-infection with other pathogens not detected by the test. Clinical correlation with patient history and other diagnostic information is necessary to determine patient infection status. The expected result is Negative. Fact Sheet for Patients:  U622787 Fact Sheet for Healthcare Providers: https://www.moore.com/ This test is not yet approved or cleared by the https://www.young.biz/ FDA and  has been authorized for detection and/or diagnosis of SARS-CoV-2 by FDA under an Emergency Use Authorization (EUA).  This EUA will remain in effect (meaning this test can be used)  for the duration of  the COVID-19 declaration under Section 564(b)(1) of the Act, 21 U.S.C. section 360bbb-3(b)(1), unless the authorization  is terminated or revoked sooner.    Influenza A by PCR NEGATIVE NEGATIVE Final   Influenza B by PCR NEGATIVE NEGATIVE Final    Comment: (NOTE) The Xpert Xpress SARS-CoV-2/FLU/RSV assay is intended as an aid in  the diagnosis of influenza from Nasopharyngeal swab specimens and  should not be used as a sole basis for treatment. Nasal washings and  aspirates are unacceptable for Xpert Xpress SARS-CoV-2/FLU/RSV  testing. Fact Sheet for Patients: [place] Fact Sheet for Healthcare Providers: https://www.moore.com/ This test is not yet approved or cleared by the https://www.young.biz/ FDA and  has been authorized for detection and/or diagnosis of SARS-CoV-2 by  FDA under an Emergency Use Authorization (EUA). This EUA will remain  in effect (meaning this test can be used) for the duration of the  Covid-19 declaration under Section 564(b)(1) of the Act, 21  U.S.C. section 360bbb-3(b)(1), unless the authorization is  terminated or revoked. Performed at [facility], [address], [place], [place]   Urine Culture  Status: Abnormal   Collection Time: 06/23/19  4:00 PM   Specimen: Urine, Clean Catch  Result Value Ref Range Status   Specimen Description   Final    URINE, CLEAN CATCH Performed at [facility], [address] [place], [place] [address]    Special Requests   Final    NONE Performed at [facility], [address], [place], [place] [address]    Culture (A)  Final    <10,000 COLONIES/mL INSIGNIFICANT GROWTH Performed at [facility] [address] [place], [place] [address]    Report Status 06/25/2019 FINAL  Final  Culture, Urine     Status: None   Collection Time: 06/28/19  8:45 PM   Specimen: Urine, Catheterized  Result Value Ref Range Status   Specimen Description   Final    URINE, CATHETERIZED Performed at [facility], [address], [place], [place] [address]    Special Requests   Final     NONE Performed at [facility], [address] [place], [place] [address]    Culture   Final    NO GROWTH Performed at [facility], [place] [address] [place],  [address]    Report Status 06/30/2019 FINAL  Final     Scheduled Meds: . atorvastatin  20 mg Oral q morning - 10a  . insulin aspart  0-9 Units Subcutaneous TID WC  . pantoprazole  40 mg Oral Daily  . QUEtiapine  100 mg Oral QHS  . zinc sulfate  220 mg Oral Daily   Continuous Infusions: . dextrose 5 % and 0.45 % NaCl with KCl 20 mEq/L 100 mL/hr at 07/01/19 1215    Procedures/Studies: CT HEAD WO CONTRAST  Result Date: 06/29/2019 CLINICAL DATA:  Encephalopathy. Additional history provided: Combative, restless, cognitive impairment with delirium, fever. COVID positive. History of diabetes mellitus, hypertension. EXAM: CT HEAD WITHOUT CONTRAST TECHNIQUE: Contiguous axial images were obtained from the base of the skull through the vertex without intravenous contrast. COMPARISON:  No pertinent prior studies available for comparison. FINDINGS: Brain: There is no evidence of acute intracranial hemorrhage or intracranial mass. No midline shift.No demarcated cortical infarction. A small hypodense extra-axial subdural collection is questioned overlying the right cerebral convexity, measuring up to 6 mm (series 4, image 27). Moderate generalized parenchymal atrophy. Vascular: No hyperdense vessel. Atherosclerotic calcifications Skull: Normal. Negative for fracture or focal lesion. Sinuses/Orbits: Visualized orbits demonstrate no acute abnormality. Mild ethmoid and right maxillary sinus mucosal thickening. Small left maxillary sinus mucous retention cyst. No significant mastoid effusion. Other: Small metallic foreign body within the scalp or possibly imbedded within the superficial aspect of the left frontal calvarium. IMPRESSION: No acute intracranial hemorrhage or demarcated cortical infarct. Questionable small hypodense extra-axial  subdural collection measuring up to 6 mm in thickness overlying the right cerebral convexity. This could reflect a hygroma or chronic subdural hematoma. No midline shift. Moderate generalized parenchymal atrophy. Mild paranasal sinus mucosal thickening. Left maxillary sinus mucous retention cyst. Electronically Signed   By: Kellie Simmering DO   On: 06/29/2019 18:08   DG Chest Port 1 View  Result Date: 06/23/2019 CLINICAL DATA:  Shortness of breath for 7 days EXAM: PORTABLE CHEST 1 VIEW COMPARISON:  None. FINDINGS: Mediastinal contour is normal. Heart size is enlarged. There is mild hazy opacity of both lung bases. There is no pulmonary edema. No acute abnormalities identified in the visualized bony structures. IMPRESSION: Mild hazy opacity of both lung bases, developing pneumonia is not excluded. Electronically Signed  By: Sherian Rein M.D.   On: 06/23/2019 12:37    Erick Blinks, MD  [facility]  If 7PM-7AM, please contact night-coverage [email or web address]  07/01/2019, 6:18 PM   LOS: 8 days

## 2019-07-01 NOTE — Progress Notes (Signed)
ANTICOAGULATION CONSULT NOTE - Initial Consult  Pharmacy Consult for lovenox Indication: VTE treatment  No Known Allergies  Patient Measurements: Height: 5\' 10"  (177.8 cm) Weight: 104.3 kg (230 lb) IBW/kg (Calculated) : 73  Vital Signs: Temp: 98.8 F (37.1 C) (04/04 1802) Temp Source: Oral (04/04 1802) BP: 129/75 (04/04 1802) Pulse Rate: 86 (04/04 1802)  Labs: Recent Labs    06/29/19 0607 06/29/19 0607 06/30/19 0649 07/01/19 0616  HGB 9.8*   < > 10.4* 11.0*  HCT 32.2*  --  35.4* 37.2*  PLT 360  --  353 302  CREATININE 1.25*  --  1.04 1.08   < > = values in this interval not displayed.    Estimated Creatinine Clearance: 63.8 mL/min (by C-G formula based on SCr of 1.08 mg/dL).   Medical History: Past Medical History:  Diagnosis Date  . Arthritis   . Diabetes mellitus   . Hypertension     Medications:  Medications Prior to Admission  Medication Sig Dispense Refill Last Dose  . acetaminophen (TYLENOL) 650 MG CR tablet Take 1,300 mg by mouth 2 (two) times daily as needed for pain.      08/31/19 aspirin EC 81 MG tablet Take 81 mg by mouth daily as needed.      [name] atorvastatin (LIPITOR) 10 MG tablet Take 10 mg by mouth every morning.    06/22/2019  . fexofenadine (ALLEGRA) 180 MG tablet Take 1 tablet by mouth daily.   06/22/2019  . gabapentin (NEURONTIN) 300 MG capsule Take 300 mg by mouth daily.   06/22/2019  . HYDROcodone-acetaminophen (NORCO/VICODIN) 5-325 MG per tablet Take 1 tablet by mouth 2 (two) times daily as needed for moderate pain or severe pain.     06/24/2019 lisinopril-hydrochlorothiazide (ZESTORETIC) 20-12.5 MG tablet Take 1 tablet by mouth daily.   06/22/2019  . meloxicam (MOBIC) 7.5 MG tablet Take 7.5 mg by mouth 2 (two) times daily.   06/22/2019  . metFORMIN (GLUCOPHAGE) 1000 MG tablet Take 1,000 mg by mouth 2 (two) times daily with a meal.     06/22/2019  . methocarbamol (ROBAXIN) 500 MG tablet Take 500 mg by mouth at bedtime.   06/22/2019  . Omega-3 Fatty Acids (FISH  OIL) 1000 MG CAPS Take 2 capsules by mouth in the morning and at bedtime.   06/22/2019  . omeprazole (PRILOSEC) 40 MG capsule Take 40 mg by mouth 2 (two) times daily.   06/22/2019    Assessment: 85 yo male present with Acute respiratory failure with hypoxiadue COVID-19 pneumonia. In addition, the patient had elevated D-Dimer and there is concern for DVT. Hemoglobin is stable. Patient to have venous doppler 97. Pharmacy asked to start lovenox.  Goal of Therapy:  Monitor platelets by anticoagulation protocol: Yes   Plan:  lovenox 1mg /kg(100mg ) sq q12h Monitor for s/s of bleeding F/U plan  [place], BS , BCPS Clinical Pharmacist Pager [phone] 07/01/2019,6:25 PM

## 2019-07-02 ENCOUNTER — Inpatient Hospital Stay (HOSPITAL_COMMUNITY): Payer: Medicare HMO

## 2019-07-02 ENCOUNTER — Inpatient Hospital Stay (HOSPITAL_COMMUNITY)
Admit: 2019-07-02 | Discharge: 2019-07-02 | Disposition: A | Discharge status: Home / Self Care | Payer: Medicare HMO | Attending: Internal Medicine | Admitting: Internal Medicine

## 2019-07-02 DIAGNOSIS — R4182 Altered mental status, unspecified: Secondary | ICD-10-CM

## 2019-07-02 DIAGNOSIS — I2699 Other pulmonary embolism without acute cor pulmonale: Secondary | ICD-10-CM | POA: Diagnosis not present

## 2019-07-02 DIAGNOSIS — R413 Other amnesia: Secondary | ICD-10-CM

## 2019-07-02 LAB — BASIC METABOLIC PANEL
Anion gap: 10 (ref 5–15)
BUN: 22 mg/dL (ref 8–23)
CO2: 23 mmol/L (ref 22–32)
Calcium: 8.5 mg/dL — ABNORMAL LOW (ref 8.9–10.3)
Chloride: 108 mmol/L (ref 98–111)
Creatinine, Ser: 1.05 mg/dL (ref 0.61–1.24)
GFR calc Af Amer: >60 mL/min (ref >60)
GFR calc non Af Amer: >60 mL/min (ref >60)
Glucose, Bld: 100 mg/dL — ABNORMAL HIGH (ref 70–99)
Potassium: 4.3 mmol/L (ref 3.5–5.1)
Sodium: 141 mmol/L (ref 135–145)

## 2019-07-02 LAB — GLUCOSE, CAPILLARY
Glucose-Capillary: 87 mg/dL (ref 70–99)
Glucose-Capillary: 101 mg/dL — ABNORMAL HIGH (ref 70–99)
Glucose-Capillary: 103 mg/dL — ABNORMAL HIGH (ref 70–99)

## 2019-07-02 LAB — CBC
HCT: 37.9 % — ABNORMAL LOW (ref 39.0–52.0)
Hemoglobin: 11.4 g/dL — ABNORMAL LOW (ref 13.0–17.0)
MCH: 25.2 pg — ABNORMAL LOW (ref 26.0–34.0)
MCHC: 30.1 g/dL (ref 30.0–36.0)
MCV: 83.7 fL (ref 80.0–100.0)
Platelets: 257 10*3/uL (ref 150–400)
RBC: 4.53 MIL/uL (ref 4.22–5.81)
RDW: 15.6 % — ABNORMAL HIGH (ref 11.5–15.5)
WBC: 8.8 10*3/uL (ref 4.0–10.5)
nRBC: 0 % (ref 0.0–0.2)

## 2019-07-02 LAB — D-DIMER, QUANTITATIVE: D-Dimer, Quant: 10.02 ug/mL-FEU — ABNORMAL HIGH (ref 0.00–0.50)

## 2019-07-02 LAB — C-REACTIVE PROTEIN: CRP: 1.8 mg/dL — ABNORMAL HIGH (ref <1.0)

## 2019-07-02 LAB — FERRITIN: Ferritin: 67 ng/mL (ref 24–336)

## 2019-07-02 MED ORDER — ACETAMINOPHEN 325 MG PO TABS: 650.0000 mg | ORAL_TABLET | Freq: Four times a day (QID) | ORAL | Status: DC | PRN

## 2019-07-02 MED ORDER — ACETAMINOPHEN 650 MG RE SUPP
650.0000 mg | Freq: Four times a day (QID) | RECTAL | Status: DC | PRN
  Administered 2019-07-02: 650 mg via RECTAL
  Filled 2019-07-02: qty 1

## 2019-07-02 NOTE — Progress Notes (Signed)
[name]           PROGRESS NOTE  Blake Collins YWV:[id number] DOB: 06-24-1934 DOA: 06/23/2019 PCP: Joette Catching, MD  Brief History: 85 y.o.malewith medical history ofdiabetes mellitus type 2, hypertension, hyperlipidemia, CKD stage III, chronic low back pain, GERD, anxiety presenting with 1 to 2-week history of generalized weakness. The patient has been complaining of some intermittent rectal bleeding for the better part of a week. He denies any hemoptysis, hematemesis, melena, constipation, diarrhea, abdominal pain. He has not had any fevers, chills, chest pain, cough, hemoptysis. The patient does complain of some intermittent shortness of breath over the past 2 to 3 days and worsening generalized weakness. He denies any recent travels, worsening lower extremity edema or increasing abdominal girth or orthopnea. As result, he presented for further evaluation. He denies any falling or syncope. He denies any new medications other than the fact that he received his first COVID-19 vaccination 3 days prior to this admission. He denies any dysuria, hematuria, headache, neck pain, flank pain. He quit smoking 20 years ago after approximately 60pack-year history. Notably, the patient states that his son with whom he lives has had some URI type symptoms. Apparently his son went to get a COVID-19 test, but the results are unknown at this time. In the emergency department, the patient was afebrile hemodynamically stable with oxygen saturation 86% on room air. He was placed on 2 L with oxygen saturation 95%. BMP showed a serum creatinine 1.85 which is above his usual baseline. LFTs were unremarkable. WBC 8.3, hemoglobin 10.0, platelets 277,000. Chest x-ray suggested possible hazy basilar opacities, cardiomegaly.Although initial POC SARS-CoV2 antigen was neg, his RT-PCR came back positive.  Assessment/Plan: Acute respiratory failure with hypoxiadue COVID-19 pneumonia -Likely multifactorial  including underlying COPD,OSA and COVID-19 -Currently stable on 2 L nasal cannula -Check procalcitonin0.20 -WNI627 -CRP20.8>>20.2>>>3.9 -Ferritin97>>115>>>80 -D-dimer--3.31>>3.17>>2.54 -EKG shows sinus rhythm, IVCD, nonspecific ST ST wave changes -SARS-CoV2 RT-PCR--positive -He completed his course of remdesivir on 3/31  Symptomatic anemia/rectal bleeding -Review of the medical record shows baseline hemoglobin11-12 -GI consultappreciated--no intervention until recovered from COVID unless becomes unstable -Clear liquid diet>>>advance -ContinuePPI -Type and screen -Hgb stable--minimal drop due to dilution  Acute on chronic renal failure--CKD stage IIIb -Baseline creatinine 1.2-1.5 -serum creatinine peaked 1.85 -Secondary to volume depletion -P.o. intake has been poor the last few days and creatinine began to trend up -creatinine is now normal with hydration -Holding lisinopril  Pyuria -concerned about UTI -start ceftriaxone pending culture data -urine culture <10K organisms -d/c ceftriaxone-->had 3 days  Cognitive impairment with delirium -possibly has underlying dementia--daughter agrees  -Patient was very functional prior to admission (driving, paying bills) -Patient continues with worsening agitation, refusing care -He has had a decline over the past few days and is currently not at his baseline -He is receiving Haldol/Ativan as needed for agitation -Started on Seroquel nightly to help with sundowning -CT head did not show any acute process -ammonia checked and was normal -ABG does not show elevated PCO2 -EEG did not show any epileptiform discharges -We will avoid giving further Ativan overnight -If remains confused/agitated, can consider MRI brain tomorrow.  Generalized weakness -Likely multifactorial includingCOVID 19,symptomatic anemia, acute on chronic renal failure -SerumB12--1289 -folate--15.1 -TSH--1.296 -PT evaluation-->SNF  Diabetes  mellitus type 2 -Holding metformin and glipizide -NovoLog sliding scale -06/23/19 A1C--6.4  Essential hypertension -Holding lisinopril in the setting of soft blood pressure and acute on chronic renal failure -BP remains well controlled  COPD -started combivent  Hyperlipidemia -Continue statin  Hypomagnesemia/hypophosphatemia -repleted  Acute pulmonary embolus -Noted on CT angiogram of chest -Likely related to COVID-19 -Started on therapeutic dose Lovenox -Venous Doppler lower extremity negative for DVT   Disposition Plan: Patient From: Home D/C Place: Skilled nursing facility placement once mental status has improved to baseline Barriers:  Worsening mental status and worsening p.o. intake  Family Communication:Daughter updated on phone 4/5  Consultants:GI  Code Status: FULL   DVT Prophylaxis: Lovenox   Procedures: As Listed in Progress Note Above  Antibiotics: Ceftriaxone 3/28>>>3/30   Subjective: Patient is somnolent, does not follow commands or answer questions.  Objective: Vitals:   07/02/19 0556 07/02/19 1256 07/02/19 1618 07/02/19 1740  BP: 118/72 121/74  123/74  Pulse: 91 89  (!) 110  Resp: 18 18  18   Temp: 98.7 F (37.1 C) 98.8 F (37.1 C) (!) 100.7 F (38.2 C) 99.6 F (37.6 C)  TempSrc: Oral Oral  Axillary  SpO2: 91% 92%  99%  Weight:      Height:        Intake/Output Summary (Last 24 hours) at 07/02/2019 1940 Last data filed at 07/02/2019 1700 Gross per 24 hour  Intake 0 ml  Output 600 ml  Net -600 ml   Weight change:  Exam:  General exam: Somnolent, confused Respiratory system: Clear to auscultation. Respiratory effort normal. Cardiovascular system:RRR. No murmurs, rubs, gallops. Gastrointestinal system: Abdomen is nondistended, soft and nontender. No organomegaly or masses felt. Normal bowel sounds heard. Central nervous system: Alert and oriented. No focal neurological deficits. Extremities: No C/C/E, +pedal  pulses Skin: No rashes, lesions or ulcers Psychiatry: Somnolent, confused  Data Reviewed: I have personally reviewed following labs and imaging studies Basic Metabolic Panel: Recent Labs  Lab 06/28/19 0451 06/29/19 0607 06/30/19 0649 07/01/19 0616 07/02/19 0510  NA 152* 147* 146* 142 141  K 3.8 4.0 4.2 3.9 4.3  CL 114* 114* 111 108 108  CO2 23 25 26 24 23   GLUCOSE 126* 152* 153* 128* 100*  BUN 45* 38* 29* 25* 22  CREATININE 1.52* 1.25* 1.04 1.08 1.05  CALCIUM 8.8* 8.3* 8.5* 8.4* 8.5*   Liver Function Tests: Recent Labs  Lab 06/26/19 0433 06/27/19 0600 06/29/19 0607  AST 34 34 123*  ALT 21 25 66*  ALKPHOS 56 57 57  BILITOT 0.6 0.7 0.8  PROT 5.9* 6.2* 6.0*  ALBUMIN 2.4* 2.6* 2.5*   No results for input(s): LIPASE, AMYLASE in the last 168 hours. Recent Labs  Lab 06/29/19 0618  AMMONIA 10   Coagulation Profile: No results for input(s): INR, PROTIME in the last 168 hours. CBC: Recent Labs  Lab 06/26/19 0433 06/26/19 0433 06/27/19 0600 06/29/19 0607 06/30/19 0649 07/01/19 0616 07/02/19 0510  WBC 8.5   < > 8.9 11.3* 11.6* 12.3* 8.8  NEUTROABS 7.1  --  7.3  --   --   --   --   HGB 9.1*   < > 9.7* 9.8* 10.4* 11.0* 11.4*  HCT 29.7*   < > 31.6* 32.2* 35.4* 37.2* 37.9*  MCV 82.5   < > 83.6 85.0 85.9 85.3 83.7  PLT 407*   < > 417* 360 353 302 257   < > = values in this interval not displayed.   Cardiac Enzymes: No results for input(s): CKTOTAL, CKMB, CKMBINDEX, TROPONINI in the last 168 hours. BNP: Invalid input(s): POCBNP CBG: Recent Labs  Lab 07/01/19 1759 07/01/19 2212 07/02/19 0747 07/02/19 1124 07/02/19 1612  GLUCAP 136* 100* 87 101* 103*   HbA1C: No results for input(s): HGBA1C  in the last 72 hours. Urine analysis:    Component Value Date/Time   COLORURINE YELLOW 06/28/2019 2045   APPEARANCEUR CLEAR 06/28/2019 2045   LABSPEC 1.018 06/28/2019 2045   PHURINE 5.0 06/28/2019 2045   GLUCOSEU NEGATIVE 06/28/2019 2045   HGBUR LARGE (A)  06/28/2019 2045   BILIRUBINUR NEGATIVE 06/28/2019 2045   KETONESUR 5 (A) 06/28/2019 2045   PROTEINUR 30 (A) 06/28/2019 2045   UROBILINOGEN 0.2 02/17/2011 1334   NITRITE NEGATIVE 06/28/2019 2045   LEUKOCYTESUR NEGATIVE 06/28/2019 2045   Sepsis Labs: (procalcitonin:4,lacticidven:4) ) Recent Results (from the past 240 hour(s))  Respiratory Panel by RT PCR (Flu A&B, Covid) - Nasopharyngeal Swab     Status: Abnormal   Collection Time: 06/23/19 11:45 AM   Specimen: Nasopharyngeal Swab  Result Value Ref Range Status   SARS Coronavirus 2 by RT PCR POSITIVE (A) NEGATIVE Final    Comment: RESULT CALLED TO, READ BACK BY AND VERIFIED WITH: MARY @ 1518 ON [id number] BY HENDERSON L. (NOTE) SARS-CoV-2 target nucleic acids are DETECTED. SARS-CoV-2 RNA is generally detectable in upper respiratory specimens  during the acute phase of infection. Positive results are indicative of the presence of the identified virus, but do not rule out bacterial infection or co-infection with other pathogens not detected by the test. Clinical correlation with patient history and other diagnostic information is necessary to determine patient infection status. The expected result is Negative. Fact Sheet for Patients:  https://www.moore.com/ Fact Sheet for Healthcare Providers: https://www.young.biz/ This test is not yet approved or cleared by the [place] FDA and  has been authorized for detection and/or diagnosis of SARS-CoV-2 by FDA under an Emergency Use Authorization (EUA).  This EUA will remain in effect (meaning this test can be used)  for the duration of  the COVID-19 declaration under Section 564(b)(1) of the Act, 21 U.S.C. section 360bbb-3(b)(1), unless the authorization is terminated or revoked sooner.    Influenza A by PCR NEGATIVE NEGATIVE Final   Influenza B by PCR NEGATIVE NEGATIVE Final    Comment: (NOTE) The Xpert Xpress SARS-CoV-2/FLU/RSV assay  is intended as an aid in  the diagnosis of influenza from Nasopharyngeal swab specimens and  should not be used as a sole basis for treatment. Nasal washings and  aspirates are unacceptable for Xpert Xpress SARS-CoV-2/FLU/RSV  testing. Fact Sheet for Patients: https://www.moore.com/ Fact Sheet for Healthcare Providers: https://www.young.biz/ This test is not yet approved or cleared by the [place] FDA and  has been authorized for detection and/or diagnosis of SARS-CoV-2 by  FDA under an Emergency Use Authorization (EUA). This EUA will remain  in effect (meaning this test can be used) for the duration of the  Covid-19 declaration under Section 564(b)(1) of the Act, 21  U.S.C. section 360bbb-3(b)(1), unless the authorization is  terminated or revoked. Performed at [facility], [address] [place], [place] [address]   Urine Culture     Status: Abnormal   Collection Time: 06/23/19  4:00 PM   Specimen: Urine, Clean Catch  Result Value Ref Range Status   Specimen Description   Final    URINE, CLEAN CATCH Performed at [facility], [address] [place], [place] [address]    Special Requests   Final    NONE Performed at [facility], [address] [place], [place] [address]    Culture (A)  Final    <10,000 COLONIES/mL INSIGNIFICANT GROWTH Performed at [facility], [address] [place], [place] [address]    Report  Status 06/25/2019 FINAL  Final  Culture, Urine     Status: None   Collection Time: 06/28/19  8:45 PM   Specimen: Urine, Catheterized  Result Value Ref Range Status   Specimen Description   Final    URINE, CATHETERIZED Performed at [facility], [address], [place], [place] [address]    Special Requests   Final    NONE Performed at [facility], [address], [place], [place] [address]    Culture   Final    NO GROWTH Performed at [facility], [address], [place], [place] [address]    Report  Status 06/30/2019 FINAL  Final     Scheduled Meds: . atorvastatin  20 mg Oral q morning - 10a  . enoxaparin (LOVENOX) injection  100 mg Subcutaneous Q12H  . insulin aspart  0-9 Units Subcutaneous TID WC  . pantoprazole  40 mg Oral Daily  . QUEtiapine  100 mg Oral QHS  . zinc sulfate  220 mg Oral Daily   Continuous Infusions: . dextrose 5 % and 0.45 % NaCl with KCl 20 mEq/L 100 mL/hr at 07/01/19 1215    Procedures/Studies: EEG  Result Date: 07/02/2019 Charlsie Quest, MD     07/02/2019 12:18 PM Patient Name: Blake Collins MRN: [id number] Epilepsy Attending: Charlsie Quest Referring Physician/Provider: Dr. Erick Blinks Date: 07/02/2019 Duration: 23.36 minutes. Patient history: 85 year old male with history of dementia now admitted with COVID-19 infection.  EEG to evaluate for altered mental status and seizures. Level of alertness: Lethargic AEDs during EEG study: Ativan Technical aspects: This EEG study was done with scalp electrodes positioned according to the 10-20 International system of electrode placement. Electrical activity was acquired at a sampling rate of 500Hz  and reviewed with a high frequency filter of 70Hz  and a low frequency filter of 1Hz . EEG data were recorded continuously and digitally stored. Description: No clear posterior dominant was seen.  EEG showed continuous generalized amplitude 3 to 5 Hz theta-delta slowing. Hyperventilation and photic stimulation were not performed. Abnormality - Continuous slow, generalized IMPRESSION: This study is suggestive of severe diffuse encephalopathy, nonspecific etiology. No seizures or epileptiform discharges were seen throughout the recording.   CT HEAD WO CONTRAST  Result Date: 06/29/2019 CLINICAL DATA:  Encephalopathy. Additional history provided: Combative, restless, cognitive impairment with delirium, fever. COVID positive. History of diabetes mellitus, hypertension. EXAM: CT HEAD WITHOUT CONTRAST TECHNIQUE: Contiguous  axial images were obtained from the base of the skull through the vertex without intravenous contrast. COMPARISON:  No pertinent prior studies available for comparison. FINDINGS: Brain: There is no evidence of acute intracranial hemorrhage or intracranial mass. No midline shift.No demarcated cortical infarction. A small hypodense extra-axial subdural collection is questioned overlying the right cerebral convexity, measuring up to 6 mm (series 4, image 27). Moderate generalized parenchymal atrophy. Vascular: No hyperdense vessel. Atherosclerotic calcifications Skull: Normal. Negative for fracture or focal lesion. Sinuses/Orbits: Visualized orbits demonstrate no acute abnormality. Mild ethmoid and right maxillary sinus mucosal thickening. Small left maxillary sinus mucous retention cyst. No significant mastoid effusion. Other: Small metallic foreign body within the scalp or possibly imbedded within the superficial aspect of the left frontal calvarium. IMPRESSION: No acute intracranial hemorrhage or demarcated cortical infarct. Questionable small hypodense extra-axial subdural collection measuring up to 6 mm in thickness overlying the right cerebral convexity. This could reflect a hygroma or chronic subdural hematoma. No midline shift. Moderate generalized parenchymal atrophy. Mild paranasal sinus mucosal thickening. Left maxillary sinus mucous  retention cyst. Electronically Signed   By: Kellie Simmering DO   On: 06/29/2019 18:08   CT ANGIO CHEST PE W OR WO CONTRAST  Result Date: 07/02/2019 CLINICAL DATA:  Intermittent shortness of breath times 2-3 days. EXAM: CT ANGIOGRAPHY CHEST WITH CONTRAST TECHNIQUE: Multidetector CT imaging of the chest was performed using the standard protocol during bolus administration of intravenous contrast. Multiplanar CT image reconstructions and MIPs were obtained to evaluate the vascular anatomy. CONTRAST:  1102mL OMNIPAQUE IOHEXOL 350 MG/ML SOLN COMPARISON:  None. FINDINGS:  Cardiovascular: Mild intraluminal filling defects are seen involving multiple middle lobe and lower lobe branches of the right pulmonary artery. Normal heart size. No pericardial effusion. Marked severity coronary artery calcification is seen. Mediastinum/Nodes: No enlarged mediastinal, hilar, or axillary lymph nodes. Thyroid gland, trachea, and esophagus demonstrate no significant findings. Lungs/Pleura: Moderate to marked severity patchy infiltrates are seen throughout both lungs. There is no evidence of a pleural effusion or pneumothorax. Upper Abdomen: A 2.8 cm x 1.8 cm cystic appearing area is seen within the left lobe of the liver. A solitary subcentimeter gallstone is seen within the lumen of an otherwise normal-appearing gallbladder. Musculoskeletal: Multilevel degenerative changes seen throughout the thoracic spine. Review of the MIP images confirms the above findings. IMPRESSION: 1. Mild to moderate severity pulmonary embolism involving multiple middle lobe and lower lobe branches of the right pulmonary artery. 2. Moderate to marked severity patchy bilateral infiltrates. 3. Marked severity coronary artery calcification. 4. Cholelithiasis. 5. 2.8 cm x 1.8 cm cystic appearing area within the left lobe of the liver. Electronically Signed   By: Virgina Norfolk M.D.   On: 07/02/2019 01:08   US Venous Img Lower Bilateral (DVT)  Result Date: 07/02/2019 CLINICAL DATA:  Pulmonary embolism.  Evaluate for DVT. EXAM: BILATERAL LOWER EXTREMITY VENOUS DOPPLER ULTRASOUND TECHNIQUE: Gray-scale sonography with graded compression, as well as color Doppler and duplex ultrasound were performed to evaluate the lower extremity deep venous systems from the level of the common femoral vein and including the common femoral, femoral, profunda femoral, popliteal and calf veins including the posterior tibial, peroneal and gastrocnemius veins when visible. The superficial great saphenous vein was also interrogated. Spectral  Doppler was utilized to evaluate flow at rest and with distal augmentation maneuvers in the common femoral, femoral and popliteal veins. COMPARISON:  None. FINDINGS: RIGHT LOWER EXTREMITY Common Femoral Vein: No evidence of thrombus. Normal compressibility, respiratory phasicity and response to augmentation. Saphenofemoral Junction: No evidence of thrombus. Normal compressibility and flow on color Doppler imaging. Profunda Femoral Vein: No evidence of thrombus. Normal compressibility and flow on color Doppler imaging. Femoral Vein: No evidence of thrombus. Normal compressibility, respiratory phasicity and response to augmentation. Popliteal Vein: No evidence of thrombus. Normal compressibility, respiratory phasicity and response to augmentation. Calf Veins: No evidence of thrombus. Normal compressibility and flow on color Doppler imaging. Superficial Great Saphenous Vein: No evidence of thrombus. Normal compressibility. Other Findings:  None. LEFT LOWER EXTREMITY - evaluation of the left lower extremity is degraded/limited due to patient's inability to tolerate the examination. Common Femoral Vein: No evidence of thrombus. Normal compressibility, respiratory phasicity and response to augmentation. Saphenofemoral Junction: Not evaluated Profunda Femoral Vein: Not evaluated Femoral Vein: Not evaluated Popliteal Vein: Not evaluated Calf Veins: No evidence of thrombus. Normal compressibility and flow on color Doppler imaging. Superficial Great Saphenous Vein: No evidence of thrombus. Normal compressibility. Other Findings:  None. IMPRESSION: 1. No evidence of DVT within right lower extremity. 2. No evidence of DVT within the  left lower extremity, though evaluation degraded/limited secondary to patient's inability to tolerate the examination. Electronically Signed   By: Simonne ComeJohn  Watts M.D.   On: 07/02/2019 12:26   DG Chest Port 1 View  Result Date: 06/23/2019 CLINICAL DATA:  Shortness of breath for 7 days EXAM: PORTABLE  CHEST 1 VIEW COMPARISON:  None. FINDINGS: Mediastinal contour is normal. Heart size is enlarged. There is mild hazy opacity of both lung bases. There is no pulmonary edema. No acute abnormalities identified in the visualized bony structures. IMPRESSION: Mild hazy opacity of both lung bases, developing pneumonia is not excluded. Electronically Signed   By: Sherian ReinWei-Chen  Lin M.D.   On: 06/23/2019 12:37   US EKG SITE RITE  Result Date: 07/01/2019 If Site Rite image not attached, placement could not be confirmed due to current cardiac rhythm.   Erick BlinksJehanzeb Einer Meals, MD  [facility]  If 7PM-7AM, please contact night-coverage [email or web address]  07/02/2019, 7:40 PM   LOS: 9 days

## 2019-07-02 NOTE — Progress Notes (Signed)
Pt has a new 20G IV in the upper right arm. CT was able to be completed. Pt is now back from CT and is in his room resting comfortably. Will continue to monitor pt.

## 2019-07-02 NOTE — Progress Notes (Signed)
EEG completed, results pending. 

## 2019-07-02 NOTE — Procedures (Signed)
Patient Name: Blake Collins  MRN: [id number]  Epilepsy Attending: Charlsie Quest  Referring Physician/Provider: Dr. Erick Blinks Date: 07/02/2019 Duration: 23.36 minutes.  Patient history: 85 year old male with history of dementia now admitted with COVID-19 infection.  EEG to evaluate for altered mental status and seizures.  Level of alertness: Lethargic  AEDs during EEG study: Ativan  Technical aspects: This EEG study was done with scalp electrodes positioned according to the 10-20 International system of electrode placement. Electrical activity was acquired at a sampling rate of 500Hz  and reviewed with a high frequency filter of 70Hz  and a low frequency filter of 1Hz . EEG data were recorded continuously and digitally stored.   Description: No clear posterior dominant was seen.  EEG showed continuous generalized amplitude 3 to 5 Hz theta-delta slowing. Hyperventilation and photic stimulation were not performed.  Abnormality - Continuous slow, generalized  IMPRESSION: This study is suggestive of severe diffuse encephalopathy, nonspecific etiology. No seizures or epileptiform discharges were seen throughout the recording.  Saliha Salts 

## 2019-07-03 ENCOUNTER — Inpatient Hospital Stay (HOSPITAL_COMMUNITY): Payer: Medicare HMO

## 2019-07-03 LAB — BASIC METABOLIC PANEL
Anion gap: 11 (ref 5–15)
BUN: 29 mg/dL — ABNORMAL HIGH (ref 8–23)
CO2: 23 mmol/L (ref 22–32)
Calcium: 8.3 mg/dL — ABNORMAL LOW (ref 8.9–10.3)
Chloride: 108 mmol/L (ref 98–111)
Creatinine, Ser: 1.33 mg/dL — ABNORMAL HIGH (ref 0.61–1.24)
GFR calc Af Amer: 57 mL/min — ABNORMAL LOW (ref >60)
GFR calc non Af Amer: 49 mL/min — ABNORMAL LOW (ref >60)
Glucose, Bld: 100 mg/dL — ABNORMAL HIGH (ref 70–99)
Potassium: 4.1 mmol/L (ref 3.5–5.1)
Sodium: 142 mmol/L (ref 135–145)

## 2019-07-03 LAB — CBC
HCT: 36.8 % — ABNORMAL LOW (ref 39.0–52.0)
Hemoglobin: 11.1 g/dL — ABNORMAL LOW (ref 13.0–17.0)
MCH: 25.8 pg — ABNORMAL LOW (ref 26.0–34.0)
MCHC: 30.2 g/dL (ref 30.0–36.0)
MCV: 85.6 fL (ref 80.0–100.0)
Platelets: 212 10*3/uL (ref 150–400)
RBC: 4.3 MIL/uL (ref 4.22–5.81)
RDW: 15.9 % — ABNORMAL HIGH (ref 11.5–15.5)
WBC: 6.9 10*3/uL (ref 4.0–10.5)
nRBC: 0 % (ref 0.0–0.2)

## 2019-07-03 LAB — FERRITIN: Ferritin: 66 ng/mL (ref 24–336)

## 2019-07-03 LAB — GLUCOSE, CAPILLARY
Glucose-Capillary: 90 mg/dL (ref 70–99)
Glucose-Capillary: 99 mg/dL (ref 70–99)
Glucose-Capillary: 91 mg/dL (ref 70–99)
Glucose-Capillary: 114 mg/dL — ABNORMAL HIGH (ref 70–99)

## 2019-07-03 LAB — D-DIMER, QUANTITATIVE: D-Dimer, Quant: 3.64 ug/mL-FEU — ABNORMAL HIGH (ref 0.00–0.50)

## 2019-07-03 LAB — C-REACTIVE PROTEIN: CRP: 3.2 mg/dL — ABNORMAL HIGH (ref <1.0)

## 2019-07-03 NOTE — Plan of Care (Signed)

## 2019-07-03 NOTE — Progress Notes (Signed)
PT Cancellation Note  Patient Details Name: Blake Collins MRN: [id number] DOB: 1934/10/05   Cancelled Treatment:    Reason Eval/Treat Not Completed: Fatigue/lethargy limiting ability to participate.  Patient very lethargic and not responsive possibly due to medication per RN.  Will check back later if time permits.   11:25 AM, 07/03/19 Ocie Bob, MPT Physical Therapist with [facility] 336 [phone] office [phone] mobile phone

## 2019-07-03 NOTE — Progress Notes (Signed)
[name]           PROGRESS NOTE  Blake Collins TFT:[id number] DOB: 1934/08/13 DOA: 06/23/2019 PCP: Dione Housekeeper, MD  Brief History: 85 y.o.malewith medical history ofdiabetes mellitus type 2, hypertension, hyperlipidemia, CKD stage III, chronic low back pain, GERD, anxiety presenting with 1 to 2-week history of generalized weakness. The patient has been complaining of some intermittent rectal bleeding for the better part of a week. He denies any hemoptysis, hematemesis, melena, constipation, diarrhea, abdominal pain. He has not had any fevers, chills, chest pain, cough, hemoptysis. The patient does complain of some intermittent shortness of breath over the past 2 to 3 days and worsening generalized weakness. He denies any recent travels, worsening lower extremity edema or increasing abdominal girth or orthopnea. As result, he presented for further evaluation. He denies any falling or syncope. He denies any new medications other than the fact that he received his first COVID-19 vaccination 3 days prior to this admission. He denies any dysuria, hematuria, headache, neck pain, flank pain. He quit smoking 20 years ago after approximately 60pack-year history. Notably, the patient states that his son with whom he lives has had some URI type symptoms. Apparently his son went to get a COVID-19 test, but the results are unknown at this time. In the emergency department, the patient was afebrile hemodynamically stable with oxygen saturation 86% on room air. He was placed on 2 L with oxygen saturation 95%. BMP showed a serum creatinine 1.85 which is above his usual baseline. LFTs were unremarkable. WBC 8.3, hemoglobin 10.0, platelets 277,000. Chest x-ray suggested possible hazy basilar opacities, cardiomegaly.Although initial POC SARS-CoV2 antigen was neg, his RT-PCR came back positive.  Assessment/Plan: Acute respiratory failure with hypoxiadue COVID-19 pneumonia -Likely multifactorial  including underlying COPD,OSA and COVID-19 -Currently stable on 2 L nasal cannula -Check procalcitonin0.20 -HCW237 -CRP20.8>>20.2>>>3.9 -Ferritin97>>115>>>80 -D-dimer--3.31>>3.17>>2.54 -EKG shows sinus rhythm, IVCD, nonspecific ST ST wave changes -SARS-CoV2 RT-PCR--positive -He completed his course of remdesivir on 3/31  Symptomatic anemia/rectal bleeding -Review of the medical record shows baseline hemoglobin11-12 -GI consultappreciated--no intervention until recovered from [facility] unless becomes unstable -Clear liquid diet>>>advance -ContinuePPI -Type and screen -Hgb stable--minimal drop due to dilution  Acute on chronic renal failure--CKD stage IIIb -Baseline creatinine 1.2-1.5 -serum creatinine peaked 1.85 -Secondary to volume depletion -P.o. intake has been poor the last few days and creatinine began to trend up -creatinine is now normal with hydration -Holding lisinopril  Pyuria -concerned about UTI -start ceftriaxone pending culture data -urine culture <10K organisms -d/c ceftriaxone-->had 3 days  Cognitive impairment with delirium -possibly has underlying dementia--daughter agrees  -Patient was very functional prior to admission (driving, paying bills) -Patient continues with worsening agitation, refusing care -He has had a decline over the past few days and is currently not at his baseline -He is receiving Haldol/Ativan as needed for agitation -Started on Seroquel nightly to help with sundowning -CT head did not show any acute process -ammonia checked and was normal -ABG does not show elevated PCO2 -EEG did not show any epileptiform discharges -MRI brain does not show any evidence of infarct -Mental status slowly improving -He has not required any Ativan -We will discontinue p.m. Seroquel and monitor mental status  Generalized weakness -Likely multifactorial includingCOVID 19,symptomatic anemia, acute on chronic renal  failure -SerumB12--1289 -folate--15.1 -TSH--1.296 -PT evaluation-->SNF  Diabetes mellitus type 2 -Holding metformin and glipizide -NovoLog sliding scale -06/23/19 A1C--6.4  Essential hypertension -Holding lisinopril in the setting of soft blood pressure and acute on chronic renal failure -BP remains  well controlled  COPD -started combivent  Hyperlipidemia -Continue statin  Hypomagnesemia/hypophosphatemia -repleted  Acute pulmonary embolus -Noted on CT angiogram of chest -Likely related to COVID-19 -Started on therapeutic dose Lovenox -We will switch to Eliquis once he is reliably taking p.o. -Venous Doppler lower extremity negative for DVT   Disposition Plan: Patient From: Home D/C Place: Skilled nursing facility placement once mental status has improved to baseline Barriers:  Worsening mental status and worsening p.o. intake  Family Communication:Daughter updated on phone 4/6  Consultants:GI  Code Status: FULL   DVT Prophylaxis: Lovenox   Procedures: As Listed in Progress Note Above  Antibiotics: Ceftriaxone 3/28>>>3/30   Subjective: Patient speech is somewhat difficult to medical, but he is awake and alert.  He is able to follow commands  Objective: Vitals:   07/02/19 1740 07/02/19 2100 07/03/19 0500 07/03/19 1435  BP: 123/74 109/76 122/76 116/65  Pulse: (!) 110 (!) 109 91 98  Resp: 18   (!) 22  Temp: 99.6 F (37.6 C) 98.9 F (37.2 C) 98.9 F (37.2 C) 97.9 F (36.6 C)  TempSrc: Axillary Axillary Axillary Oral  SpO2: 99% 98% 99% 90%  Weight:      Height:        Intake/Output Summary (Last 24 hours) at 07/03/2019 1849 Last data filed at 07/03/2019 1700 Gross per 24 hour  Intake 3187.83 ml  Output 250 ml  Net 2937.83 ml   Weight change:  Exam:  General exam: Alert, awake, still confused, not agitated, able to follow commands Respiratory system: Clear to auscultation. Respiratory effort normal. Cardiovascular  system:RRR. No murmurs, rubs, gallops. Gastrointestinal system: Abdomen is nondistended, soft and nontender. No organomegaly or masses felt. Normal bowel sounds heard. Central nervous system: Strength is equal in lower extremities and upper extremities bilaterally.  Speech seems somewhat dysarthric Extremities: No C/C/E, +pedal pulses Skin: No rashes, lesions or ulcers Psychiatry: Patient is more awake, he is not agitated   Data Reviewed: I have personally reviewed following labs and imaging studies Basic Metabolic Panel: Recent Labs  Lab 06/29/19 0607 06/30/19 0649 07/01/19 0616 07/02/19 0510 07/03/19 0539  NA 147* 146* 142 141 142  K 4.0 4.2 3.9 4.3 4.1  CL 114* 111 108 108 108  CO2 25 26 24 23 23   GLUCOSE 152* 153* 128* 100* 100*  BUN 38* 29* 25* 22 29*  CREATININE 1.25* 1.04 1.08 1.05 1.33*  CALCIUM 8.3* 8.5* 8.4* 8.5* 8.3*   Liver Function Tests: Recent Labs  Lab 06/27/19 0600 06/29/19 0607  AST 34 123*  ALT 25 66*  ALKPHOS 57 57  BILITOT 0.7 0.8  PROT 6.2* 6.0*  ALBUMIN 2.6* 2.5*   No results for input(s): LIPASE, AMYLASE in the last 168 hours. Recent Labs  Lab 06/29/19 0618  AMMONIA 10   Coagulation Profile: No results for input(s): INR, PROTIME in the last 168 hours. CBC: Recent Labs  Lab 06/27/19 0600 06/27/19 0600 06/29/19 0607 06/30/19 0649 07/01/19 0616 07/02/19 0510 07/03/19 0539  WBC 8.9   < > 11.3* 11.6* 12.3* 8.8 6.9  NEUTROABS 7.3  --   --   --   --   --   --   HGB 9.7*   < > 9.8* 10.4* 11.0* 11.4* 11.1*  HCT 31.6*   < > 32.2* 35.4* 37.2* 37.9* 36.8*  MCV 83.6   < > 85.0 85.9 85.3 83.7 85.6  PLT 417*   < > 360 353 302 257 212   < > = values in this interval  not displayed.   Cardiac Enzymes: No results for input(s): CKTOTAL, CKMB, CKMBINDEX, TROPONINI in the last 168 hours. BNP: Invalid input(s): POCBNP CBG: Recent Labs  Lab 07/02/19 1124 07/02/19 1612 07/03/19 0748 07/03/19 1109 07/03/19 1715  GLUCAP 101* 103* 90 99 91    HbA1C: No results for input(s): HGBA1C in the last 72 hours. Urine analysis:    Component Value Date/Time   COLORURINE YELLOW 06/28/2019 2045   APPEARANCEUR CLEAR 06/28/2019 2045   LABSPEC 1.018 06/28/2019 2045   PHURINE 5.0 06/28/2019 2045   GLUCOSEU NEGATIVE 06/28/2019 2045   HGBUR LARGE (A) 06/28/2019 2045   BILIRUBINUR NEGATIVE 06/28/2019 2045   KETONESUR 5 (A) 06/28/2019 2045   PROTEINUR 30 (A) 06/28/2019 2045   UROBILINOGEN 0.2 02/17/2011 1334   NITRITE NEGATIVE 06/28/2019 2045   LEUKOCYTESUR NEGATIVE 06/28/2019 2045   Sepsis Labs: @LABRCNTIP (procalcitonin:4,lacticidven:4) ) Recent Results (from the past 240 hour(s))  Culture, Urine     Status: None   Collection Time: 06/28/19  8:45 PM   Specimen: Urine, Catheterized  Result Value Ref Range Status   Specimen Description   Final    URINE, CATHETERIZED Performed at [facility], [address], [place], [place]    Special Requests   Final    NONE Performed at [facility], [address] [place], [place]    Culture   Final    NO GROWTH Performed at [facility], [address] [place], [place]    Report Status 06/30/2019 FINAL  Final     Scheduled Meds: . atorvastatin  20 mg Oral q morning - 10a  . enoxaparin (LOVENOX) injection  100 mg Subcutaneous Q12H  . insulin aspart  0-9 Units Subcutaneous TID WC  . pantoprazole  40 mg Oral Daily  . zinc sulfate  220 mg Oral Daily   Continuous Infusions: . dextrose 5 % and 0.45 % NaCl with KCl 20 mEq/L Stopped (07/01/19 1839)    Procedures/Studies: EEG  Result Date: 07/02/2019 09/01/2019, MD     07/02/2019 12:18 PM Patient Name: Blake Collins MRN: Jerilynn Mages Epilepsy Attending: [id number] Referring Physician/Provider: Dr. Charlsie Quest Date: 07/02/2019 Duration: 23.36 minutes. Patient history: 85 year old male with history of dementia now admitted with COVID-19 infection.  EEG to evaluate for altered mental status and  seizures. Level of alertness: Lethargic AEDs during EEG study: Ativan Technical aspects: This EEG study was done with scalp electrodes positioned according to the 10-20 International system of electrode placement. Electrical activity was acquired at a sampling rate of 500Hz  and reviewed with a high frequency filter of 70Hz  and a low frequency filter of 1Hz . EEG data were recorded continuously and digitally stored. Description: No clear posterior dominant was seen.  EEG showed continuous generalized amplitude 3 to 5 Hz theta-delta slowing. Hyperventilation and photic stimulation were not performed. Abnormality - Continuous slow, generalized IMPRESSION: This study is suggestive of severe diffuse encephalopathy, nonspecific etiology. No seizures or epileptiform discharges were seen throughout the recording. 97   CT HEAD WO CONTRAST  Result Date: 06/29/2019 CLINICAL DATA:  Encephalopathy. Additional history provided: Combative, restless, cognitive impairment with delirium, fever. COVID positive. History of diabetes mellitus, hypertension. EXAM: CT HEAD WITHOUT CONTRAST TECHNIQUE: Contiguous axial images were obtained from the base of the skull through the vertex without intravenous contrast. COMPARISON:  No pertinent prior studies available for comparison. FINDINGS: Brain: There is no evidence of acute intracranial hemorrhage or intracranial mass. No midline shift.No demarcated cortical  infarction. A small hypodense extra-axial subdural collection is questioned overlying the right cerebral convexity, measuring up to 6 mm (series 4, image 27). Moderate generalized parenchymal atrophy. Vascular: No hyperdense vessel. Atherosclerotic calcifications Skull: Normal. Negative for fracture or focal lesion. Sinuses/Orbits: Visualized orbits demonstrate no acute abnormality. Mild ethmoid and right maxillary sinus mucosal thickening. Small left maxillary sinus mucous retention cyst. No significant mastoid  effusion. Other: Small metallic foreign body within the scalp or possibly imbedded within the superficial aspect of the left frontal calvarium. IMPRESSION: No acute intracranial hemorrhage or demarcated cortical infarct. Questionable small hypodense extra-axial subdural collection measuring up to 6 mm in thickness overlying the right cerebral convexity. This could reflect a hygroma or chronic subdural hematoma. No midline shift. Moderate generalized parenchymal atrophy. Mild paranasal sinus mucosal thickening. Left maxillary sinus mucous retention cyst. Electronically Signed   By: Jackey Loge DO   On: 06/29/2019 18:08   CT ANGIO CHEST PE W OR WO CONTRAST  Result Date: 07/02/2019 CLINICAL DATA:  Intermittent shortness of breath times 2-3 days. EXAM: CT ANGIOGRAPHY CHEST WITH CONTRAST TECHNIQUE: Multidetector CT imaging of the chest was performed using the standard protocol during bolus administration of intravenous contrast. Multiplanar CT image reconstructions and MIPs were obtained to evaluate the vascular anatomy. CONTRAST:  OMNIPAQUE IOHEXOL 350 MG/ML SOLN COMPARISON:  None. FINDINGS: Cardiovascular: Mild intraluminal filling defects are seen involving multiple middle lobe and lower lobe branches of the right pulmonary artery. Normal heart size. No pericardial effusion. Marked severity coronary artery calcification is seen. Mediastinum/Nodes: No enlarged mediastinal, hilar, or axillary lymph nodes. Thyroid gland, trachea, and esophagus demonstrate no significant findings. Lungs/Pleura: Moderate to marked severity patchy infiltrates are seen throughout both lungs. There is no evidence of a pleural effusion or pneumothorax. Upper Abdomen: A 2.8 cm x 1.8 cm cystic appearing area is seen within the left lobe of the liver. A solitary subcentimeter gallstone is seen within the lumen of an otherwise normal-appearing gallbladder. Musculoskeletal: Multilevel degenerative changes seen throughout the thoracic  spine. Review of the MIP images confirms the above findings. IMPRESSION: 1. Mild to moderate severity pulmonary embolism involving multiple middle lobe and lower lobe branches of the right pulmonary artery. 2. Moderate to marked severity patchy bilateral infiltrates. 3. Marked severity coronary artery calcification. 4. Cholelithiasis. 5. 2.8 cm x 1.8 cm cystic appearing area within the left lobe of the liver. Electronically Signed   By: Aram Candela M.D.   On: 07/02/2019 01:08   MR BRAIN WO CONTRAST  Result Date: 07/03/2019 CLINICAL DATA:  85 year old male with altered mental status for 2 days. Positive COVID-19. Encephalopathy. Questionable extra-axial fluid collection over the right convexity on recent noncontrast CT. EXAM: MRI HEAD WITHOUT CONTRAST TECHNIQUE: Multiplanar, multiecho pulse sequences of the brain and surrounding structures were obtained without intravenous contrast. COMPARISON:  Head CT 07/19/2019. FINDINGS: Brain: Mildly asymmetric extra-axial CSF is redemonstrated along the right convexity, but on coronal T2 images there is no evidence of displacement of the dural veins, no subdural collection (series 12, image 17). No restricted diffusion to suggest acute infarction. No midline shift, mass effect, evidence of mass lesion, ventriculomegaly, extra-axial collection or acute intracranial hemorrhage. Cervicomedullary junction and pituitary are within normal limits. Artifact suspected on T2* imaging (series 10, image 11). No convincing cortical encephalomalacia or chronic cerebral blood products. Mild T2 heterogeneity in the bilateral basal ganglia mostly reflects perivascular spaces. Minimal nonspecific T2 and FLAIR hyperintensity. Brainstem and cerebellum appear negative. Vascular: Major intracranial vascular flow voids are preserved.  The left vertebral artery appears dominant. Skull and upper cervical spine: Negative visible cervical spine and bone marrow signal. Sinuses/Orbits:  Postoperative changes to both globes but otherwise negative orbits. Mild paranasal sinus mucosal thickening. Other: Mastoids are well pneumatized. Visible internal auditory structures appear normal. Scalp and face soft tissues appear negative. IMPRESSION: No acute intracranial abnormality and largely unremarkable for age noncontrast MRI appearance of the brain. Electronically Signed   By: Odessa FlemingH  Hall M.D.   On: 07/03/2019 15:45   US Venous Img Lower Bilateral (DVT)  Result Date: 07/02/2019 CLINICAL DATA:  Pulmonary embolism.  Evaluate for DVT. EXAM: BILATERAL LOWER EXTREMITY VENOUS DOPPLER ULTRASOUND TECHNIQUE: Gray-scale sonography with graded compression, as well as color Doppler and duplex ultrasound were performed to evaluate the lower extremity deep venous systems from the level of the common femoral vein and including the common femoral, femoral, profunda femoral, popliteal and calf veins including the posterior tibial, peroneal and gastrocnemius veins when visible. The superficial great saphenous vein was also interrogated. Spectral Doppler was utilized to evaluate flow at rest and with distal augmentation maneuvers in the common femoral, femoral and popliteal veins. COMPARISON:  None. FINDINGS: RIGHT LOWER EXTREMITY Common Femoral Vein: No evidence of thrombus. Normal compressibility, respiratory phasicity and response to augmentation. Saphenofemoral Junction: No evidence of thrombus. Normal compressibility and flow on color Doppler imaging. Profunda Femoral Vein: No evidence of thrombus. Normal compressibility and flow on color Doppler imaging. Femoral Vein: No evidence of thrombus. Normal compressibility, respiratory phasicity and response to augmentation. Popliteal Vein: No evidence of thrombus. Normal compressibility, respiratory phasicity and response to augmentation. Calf Veins: No evidence of thrombus. Normal compressibility and flow on color Doppler imaging. Superficial Great Saphenous Vein: No  evidence of thrombus. Normal compressibility. Other Findings:  None. LEFT LOWER EXTREMITY - evaluation of the left lower extremity is degraded/limited due to patient's inability to tolerate the examination. Common Femoral Vein: No evidence of thrombus. Normal compressibility, respiratory phasicity and response to augmentation. Saphenofemoral Junction: Not evaluated Profunda Femoral Vein: Not evaluated Femoral Vein: Not evaluated Popliteal Vein: Not evaluated Calf Veins: No evidence of thrombus. Normal compressibility and flow on color Doppler imaging. Superficial Great Saphenous Vein: No evidence of thrombus. Normal compressibility. Other Findings:  None. IMPRESSION: 1. No evidence of DVT within right lower extremity. 2. No evidence of DVT within the left lower extremity, though evaluation degraded/limited secondary to patient's inability to tolerate the examination. Electronically Signed   By: Simonne ComeJohn  Watts M.D.   On: 07/02/2019 12:26   DG Chest Port 1 View  Result Date: 06/23/2019 CLINICAL DATA:  Shortness of breath for 7 days EXAM: PORTABLE CHEST 1 VIEW COMPARISON:  None. FINDINGS: Mediastinal contour is normal. Heart size is enlarged. There is mild hazy opacity of both lung bases. There is no pulmonary edema. No acute abnormalities identified in the visualized bony structures. IMPRESSION: Mild hazy opacity of both lung bases, developing pneumonia is not excluded. Electronically Signed   By: Sherian ReinWei-Chen  Lin M.D.   On: 06/23/2019 12:37   US EKG SITE RITE  Result Date: 07/01/2019 If Site Rite image not attached, placement could not be confirmed due to current cardiac rhythm.   Erick BlinksJehanzeb Tonatiuh Mallon, MD  [facility]  If 7PM-7AM, please contact night-coverage [email or web address]  07/03/2019, 6:49 PM   LOS: 10 days

## 2019-07-03 NOTE — TOC Progression Note (Signed)
Transition of Care [facility]) - Progression Note    Patient Details  Name: Blake Collins MRN: [id number] Date of Birth: 03-Jun-1934  [facility] CM/SW Contact  Annice Needy, LCSW Phone Number: 07/03/2019, 3:29 PM  Clinical Narrative:    Tonya in admissions at [facility] discussed patient's case. Advised that she will have to speak with her DON regarding bed offer due to patient's behavior. Will call back with decision on 07/04/19.       Expected Discharge Plan and Services                                                 Social Determinants of Health (SDOH) Interventions    Readmission Risk Interventions No flowsheet data found.

## 2019-07-03 NOTE — Progress Notes (Signed)
Mittens removed from pt due to pt being more alert and cooperative during shift. No issues from patient. Speech is coherent and appropriate. Patient is able to verbalize his needs. Patient is also on room air now and O2 sats are 93% on room air. Will continue to monitor.  Manya Silvas, RN

## 2019-07-04 DIAGNOSIS — I2699 Other pulmonary embolism without acute cor pulmonale: Secondary | ICD-10-CM

## 2019-07-04 LAB — D-DIMER, QUANTITATIVE: D-Dimer, Quant: 3.18 ug/mL-FEU — ABNORMAL HIGH (ref 0.00–0.50)

## 2019-07-04 LAB — BASIC METABOLIC PANEL
Anion gap: 9 (ref 5–15)
BUN: 29 mg/dL — ABNORMAL HIGH (ref 8–23)
CO2: 25 mmol/L (ref 22–32)
Calcium: 8.3 mg/dL — ABNORMAL LOW (ref 8.9–10.3)
Chloride: 108 mmol/L (ref 98–111)
Creatinine, Ser: 1.24 mg/dL (ref 0.61–1.24)
GFR calc Af Amer: >60 mL/min (ref >60)
GFR calc non Af Amer: 54 mL/min — ABNORMAL LOW (ref >60)
Glucose, Bld: 135 mg/dL — ABNORMAL HIGH (ref 70–99)
Potassium: 3.9 mmol/L (ref 3.5–5.1)
Sodium: 142 mmol/L (ref 135–145)

## 2019-07-04 LAB — FERRITIN: Ferritin: 59 ng/mL (ref 24–336)

## 2019-07-04 LAB — GLUCOSE, CAPILLARY
Glucose-Capillary: 127 mg/dL — ABNORMAL HIGH (ref 70–99)
Glucose-Capillary: 147 mg/dL — ABNORMAL HIGH (ref 70–99)
Glucose-Capillary: 130 mg/dL — ABNORMAL HIGH (ref 70–99)
Glucose-Capillary: 111 mg/dL — ABNORMAL HIGH (ref 70–99)

## 2019-07-04 LAB — C-REACTIVE PROTEIN: CRP: 2.8 mg/dL — ABNORMAL HIGH (ref <1.0)

## 2019-07-04 NOTE — Progress Notes (Signed)
ANTICOAGULATION CONSULT NOTE - Initial Consult  Pharmacy Consult for lovenox Indication: VTE treatment  No Known Allergies  Patient Measurements: Height: 5\' 10"  (177.8 cm) Weight: 104.3 kg (230 lb) IBW/kg (Calculated) : 73  Vital Signs: Temp: 98.6 F (37 C) (04/07 0439) Temp Source: Oral (04/07 0439) BP: 123/75 (04/07 0439) Pulse Rate: 96 (04/07 0439)  Labs: Recent Labs    07/02/19 0510 07/03/19 0539 07/04/19 0528  HGB 11.4* 11.1*  --   HCT 37.9* 36.8*  --   PLT 257 212  --   CREATININE 1.05 1.33* 1.24    Estimated Creatinine Clearance: 55.5 mL/min (by C-G formula based on SCr of 1.24 mg/dL).   Medical History: Past Medical History:  Diagnosis Date  . Arthritis   . Diabetes mellitus   . Hypertension     Medications:  Medications Prior to Admission  Medication Sig Dispense Refill Last Dose  . acetaminophen (TYLENOL) 650 MG CR tablet Take 1,300 mg by mouth 2 (two) times daily as needed for pain.      09/03/19 aspirin EC 81 MG tablet Take 81 mg by mouth daily as needed.      [name] atorvastatin (LIPITOR) 10 MG tablet Take 10 mg by mouth every morning.    06/22/2019  . fexofenadine (ALLEGRA) 180 MG tablet Take 1 tablet by mouth daily.   06/22/2019  . gabapentin (NEURONTIN) 300 MG capsule Take 300 mg by mouth daily.   06/22/2019  . HYDROcodone-acetaminophen (NORCO/VICODIN) 5-325 MG per tablet Take 1 tablet by mouth 2 (two) times daily as needed for moderate pain or severe pain.     06/24/2019 lisinopril-hydrochlorothiazide (ZESTORETIC) 20-12.5 MG tablet Take 1 tablet by mouth daily.   06/22/2019  . meloxicam (MOBIC) 7.5 MG tablet Take 7.5 mg by mouth 2 (two) times daily.   06/22/2019  . metFORMIN (GLUCOPHAGE) 1000 MG tablet Take 1,000 mg by mouth 2 (two) times daily with a meal.     06/22/2019  . methocarbamol (ROBAXIN) 500 MG tablet Take 500 mg by mouth at bedtime.   06/22/2019  . Omega-3 Fatty Acids (FISH OIL) 1000 MG CAPS Take 2 capsules by mouth in the morning and at bedtime.   06/22/2019   . omeprazole (PRILOSEC) 40 MG capsule Take 40 mg by mouth 2 (two) times daily.   06/22/2019    Assessment: 85 yo male present with Acute respiratory failure with hypoxiadue COVID-19 pneumonia. In addition, the patient had elevated D-Dimer 3.64 yesterday and 3.18 today and there is concern for DVT. Hemoglobin is stable 11.1. Patient to have venous doppler 97. Pharmacy asked to start lovenox.  Goal of Therapy:  Monitor platelets by anticoagulation protocol: Yes   Plan:  lovenox 1mg /kg(100mg ) sq q12h Monitor for s/s of bleeding F/U plan  [place], PharmD, MBA, BCGP Clinical Pharmacist  07/04/2019,7:43 AM

## 2019-07-04 NOTE — Plan of Care (Signed)

## 2019-07-04 NOTE — Progress Notes (Signed)
Blake Collins Kitchen           PROGRESS NOTE  Blake Collins XBM:[id number] DOB: 11-28-1934 DOA: 06/23/2019 PCP: Joette Catching, MD  Brief History: 85 y.o.malewith medical history ofdiabetes mellitus type 2, hypertension, hyperlipidemia, CKD stage III, chronic low back pain, GERD, anxiety presenting with 1 to 2-week history of generalized weakness. The patient has been complaining of some intermittent rectal bleeding for the better part of a week. He denies any hemoptysis, hematemesis, melena, constipation, diarrhea, abdominal pain. He has not had any fevers, chills, chest pain, cough, hemoptysis. The patient does complain of some intermittent shortness of breath over the past 2 to 3 days and worsening generalized weakness. He denies any recent travels, worsening lower extremity edema or increasing abdominal girth or orthopnea. As result, he presented for further evaluation. He denies any falling or syncope. He denies any new medications other than the fact that he received his first COVID-19 vaccination 3 days prior to this admission. He denies any dysuria, hematuria, headache, neck pain, flank pain. He quit smoking 20 years ago after approximately 60pack-year history. Notably, the patient states that his son with whom he lives has had some URI type symptoms. Apparently his son went to get a COVID-19 test, but the results are unknown at this time. In the emergency department, the patient was afebrile hemodynamically stable with oxygen saturation 86% on room air. He was placed on 2 L with oxygen saturation 95%. BMP showed a serum creatinine 1.85 which is above his usual baseline. LFTs were unremarkable. WBC 8.3, hemoglobin 10.0, platelets 277,000. Chest x-ray suggested possible hazy basilar opacities, cardiomegaly.Although initial POC SARS-CoV2 antigen was neg, his RT-PCR came back positive.  Assessment/Plan: Acute respiratory failure with hypoxiadue COVID-19 pneumonia -Likely multifactorial  including underlying COPD,OSA and COVID-19 -Overall hypoxia improved significantly, continue to wean off O2 - procalcitonin0.20 -UUV253 -CRP20.8>>20.2>>>3.9 -Ferritin97>>115>>>80 -D-dimer--3.31>>3.17>>2.54 -EKG shows sinus rhythm, IVCD, nonspecific ST ST wave changes -SARS-CoV2 RT-PCR--positive -He completed his course of remdesivir on 3/31  Symptomatic anemia/rectal bleeding -Review of the medical record shows baseline hemoglobin11-12 -GI consultappreciated--no intervention until recovered from COVID unless becomes unstable -Clear liquid diet>>>advance -ContinuePPI -Type and screen -Hgb stable--some drop due to dilution  Acute on chronic renal failure--CKD stage IIIb -Baseline creatinine 1.2-1.5 -serum creatinine peaked 1.85 -Secondary to volume depletion -P.o. intake has been poor the last few days and creatinine began to trend up -creatinine is now normal with hydration -Holding lisinopril  Pyuria -concerned about UTI -urine culture <10K organisms -d/c ceftriaxone-->had 3 days  Cognitive impairment with delirium -possibly has underlying dementia--daughter agrees  -Patient was very functional prior to admission (driving, paying bills) -No further agitation, -Patient has been off restraints for more than 24 hours, -Overall much improved, -Previously he did receive Haldol/Ativan as needed for agitation----no longer requiring as needed sedatives -CT head did not show any acute process -ammonia checked and was normal -ABG does not show elevated PCO2 -EEG did not show any epileptiform discharges -MRI brain does not show any evidence of infarct -Mental status overall much improved -He has not required any Ativan -Seroquel discontinued  Generalized weakness -Likely multifactorial includingCOVID 19,symptomatic anemia, acute on chronic renal failure -SerumB12--1289 -folate--15.1 -TSH--1.296 -PT evaluation-->SNF  Diabetes mellitus type 2 -Holding  metformin and glipizide -NovoLog sliding scale -06/23/19 A1C--6.4 reflecting excellent DM control PTA  Essential hypertension -Holding lisinopril in the setting of soft blood pressure and acute on chronic renal failure -BP remains well controlled  COPD -started combivent  Hyperlipidemia -Continue statin  Hypomagnesemia/hypophosphatemia -repleted  Acute pulmonary embolus -Noted on CT angiogram of chest -Likely related to COVID-19 -Started on therapeutic dose Lovenox -We will switch to Eliquis once he is reliably taking p.o. -Venous Doppler lower extremity negative for DVT   Disposition Plan: Patient From: Home D/C Place: Skilled nursing facility placement when bed available   barriers:  Mental status-much improved patient is medically ready for discharge if bed available  Family Communication:Daughter updated on phone previously  Consultants:GI  Code Status: FULL   DVT Prophylaxis: Lovenox   Procedures: As Listed in Progress Note Above  Antibiotics: Ceftriaxone 3/28>>>3/30   Subjective: --Mental status-improved, oral intake is improved, -Patient is overall cooperative -No longer requiring sedatives and no longer requiring restraints  Objective: Vitals:   07/03/19 2328 07/04/19 0439 07/04/19 0931 07/04/19 1500  BP: 120/72 123/75  127/72  Pulse: 92 96  82  Resp: (!) 22 20  18   Temp: 98.9 F (37.2 C) 98.6 F (37 C)  99.2 F (37.3 C)  TempSrc: Oral Oral  Oral  SpO2: 92% 91% 95% 92%  Weight:      Height:        Intake/Output Summary (Last 24 hours) at 07/04/2019 1942 Last data filed at 07/04/2019 0900 Gross per 24 hour  Intake 120 ml  Output 200 ml  Net -80 ml   Weight change:  Exam:  General exam: Alert, awake, very cooperative, not agitated, able to follow commands Respiratory system: Clear to auscultation. Respiratory effort normal. Cardiovascular system:RRR. No murmurs, rubs, gallops. Gastrointestinal system: Abdomen is  nondistended, soft and nontender, Normal bowel sounds heard. Central nervous system: Strength is equal in lower extremities and upper extremities bilaterally. Extremities: No C/C/E, +pedal pulses Skin: No rashes, lesions or ulcers Psychiatry: Patient is more awake, he is not agitated, very cooperative  Data Reviewed: I have personally reviewed following labs and imaging studies Basic Metabolic Panel: Recent Labs  Lab 06/30/19 0649 07/01/19 0616 07/02/19 0510 07/03/19 0539 07/04/19 0528  NA 146* 142 141 142 142  K 4.2 3.9 4.3 4.1 3.9  CL 111 108 108 108 108  CO2 26 24 23 23 25   GLUCOSE 153* 128* 100* 100* 135*  BUN 29* 25* 22 29* 29*  CREATININE 1.04 1.08 1.05 1.33* 1.24  CALCIUM 8.5* 8.4* 8.5* 8.3* 8.3*   Liver Function Tests: Recent Labs  Lab 06/29/19 0607  AST 123*  ALT 66*  ALKPHOS 57  BILITOT 0.8  PROT 6.0*  ALBUMIN 2.5*   No results for input(s): LIPASE, AMYLASE in the last 168 hours. Recent Labs  Lab 06/29/19 0618  AMMONIA 10   Coagulation Profile: No results for input(s): INR, PROTIME in the last 168 hours. CBC: Recent Labs  Lab 06/29/19 0607 06/30/19 0649 07/01/19 0616 07/02/19 0510 07/03/19 0539  WBC 11.3* 11.6* 12.3* 8.8 6.9  HGB 9.8* 10.4* 11.0* 11.4* 11.1*  HCT 32.2* 35.4* 37.2* 37.9* 36.8*  MCV 85.0 85.9 85.3 83.7 85.6  PLT 360 353 302 257 212   Cardiac Enzymes: No results for input(s): CKTOTAL, CKMB, CKMBINDEX, TROPONINI in the last 168 hours. BNP: Invalid input(s): POCBNP CBG: Recent Labs  Lab 07/03/19 1109 07/03/19 1715 07/03/19 2316 07/04/19 0800 07/04/19 1215  GLUCAP 99 91 114* 127* 147*   HbA1C: No results for input(s): HGBA1C in the last 72 hours. Urine analysis:    Component Value Date/Time   COLORURINE YELLOW 06/28/2019 2045   APPEARANCEUR CLEAR 06/28/2019 2045   LABSPEC 1.018 06/28/2019 2045   PHURINE 5.0 06/28/2019 2045   GLUCOSEU NEGATIVE 06/28/2019  2045   HGBUR LARGE (A) 06/28/2019 2045   BILIRUBINUR NEGATIVE  06/28/2019 2045   KETONESUR 5 (A) 06/28/2019 2045   PROTEINUR 30 (A) 06/28/2019 2045   UROBILINOGEN 0.2 02/17/2011 1334   NITRITE NEGATIVE 06/28/2019 2045   LEUKOCYTESUR NEGATIVE 06/28/2019 2045   Sepsis Labs: @LABRCNTIP (procalcitonin:4,lacticidven:4) ) Recent Results (from the past 240 hour(s))  Culture, Urine     Status: None   Collection Time: 06/28/19  8:45 PM   Specimen: Urine, Catheterized  Result Value Ref Range Status   Specimen Description   Final    URINE, CATHETERIZED Performed at [facility], [address] [place], [place]    Special Requests   Final    NONE Performed at [facility], [address], [place], [place]    Culture   Final    NO GROWTH Performed at [facility], [address], [place], [place]    Report Status 06/30/2019 FINAL  Final     Scheduled Meds: . atorvastatin  20 mg Oral q morning - 10a  . enoxaparin (LOVENOX) injection  100 mg Subcutaneous Q12H  . insulin aspart  0-9 Units Subcutaneous TID WC  . pantoprazole  40 mg Oral Daily  . zinc sulfate  220 mg Oral Daily   Continuous Infusions: . dextrose 5 % and 0.45 % NaCl with KCl 20 mEq/L 100 mL/hr at 07/04/19 1519    Procedures/Studies: EEG  Result Date: 07/02/2019 09/01/2019, MD     07/02/2019 12:18 PM Patient Name: TYAIRE ODEM MRN: [facility]ily[facility]ages Epile[facility] Attending: [id number] Referring Physician/Provider: Dr. Charlsie Quest Date: 07/02/2019 Duration: 23.36 minutes. Patient history: 85 year old male with history of dementia now admitted with COVID-19 infection.  EEG to evaluate for altered mental status and seizures. Level of alertness: Lethargic AEDs during EEG study: Ativan Technical aspects: This EEG study was done with scalp electrodes positioned according to the 10-20 International system of electrode placement. Electrical activity was acquired at a sampling rate of 500Hz  and reviewed with a high frequency filter of 70Hz  and a low frequency  filter of 1Hz . EEG data were recorded continuously and digitally stored. Description: No clear posterior dominant was seen.  EEG showed continuous generalized amplitude 3 to 5 Hz theta-delta slowing. Hyperventilation and photic stimulation were not performed. Abnormality - Continuous slow, generalized IMPRESSION: This study is suggestive of severe diffuse encephalopathy, nonspecific etiology. No seizures or epileptiform discharges were seen throughout the recording. 97   CT HEAD WO CONTRAST  Result Date: 06/29/2019 CLINICAL DATA:  Encephalopathy. Additional history provided: Combative, restless, cognitive impairment with delirium, fever. COVID positive. History of diabetes mellitus, hypertension. EXAM: CT HEAD WITHOUT CONTRAST TECHNIQUE: Contiguous axial images were obtained from the base of the skull through the vertex without intravenous contrast. COMPARISON:  No pertinent prior studies available for comparison. FINDINGS: Brain: There is no evidence of acute intracranial hemorrhage or intracranial mass. No midline shift.No demarcated cortical infarction. A small hypodense extra-axial subdural collection is questioned overlying the right cerebral convexity, measuring up to 6 mm (series 4, image 27). Moderate generalized parenchymal atrophy. Vascular: No hyperdense vessel. Atherosclerotic calcifications Skull: Normal. Negative for fracture or focal lesion. Sinuses/Orbits: Visualized orbits demonstrate no acute abnormality. Mild ethmoid and right maxillary sinus mucosal thickening. Small left maxillary sinus mucous retention cyst. No significant mastoid effusion. Other: Small metallic foreign body within the scalp or possibly imbedded within the superficial aspect of the left frontal calvarium. IMPRESSION: No acute intracranial hemorrhage or  demarcated cortical infarct. Questionable small hypodense extra-axial subdural collection measuring up to 6 mm in thickness overlying the right cerebral  convexity. This could reflect a hygroma or chronic subdural hematoma. No midline shift. Moderate generalized parenchymal atrophy. Mild paranasal sinus mucosal thickening. Left maxillary sinus mucous retention cyst. Electronically Signed   By: Jackey Loge DO   On: 06/29/2019 18:08   CT ANGIO CHEST PE W OR WO CONTRAST  Result Date: 07/02/2019 CLINICAL DATA:  Intermittent shortness of breath times 2-3 days. EXAM: CT ANGIOGRAPHY CHEST WITH CONTRAST TECHNIQUE: Multidetector CT imaging of the chest was performed using the standard protocol during bolus administration of intravenous contrast. Multiplanar CT image reconstructions and MIPs were obtained to evaluate the vascular anatomy. CONTRAST:  OMNIPAQUE IOHEXOL 350 MG/ML SOLN COMPARISON:  None. FINDINGS: Cardiovascular: Mild intraluminal filling defects are seen involving multiple middle lobe and lower lobe branches of the right pulmonary artery. Normal heart size. No pericardial effusion. Marked severity coronary artery calcification is seen. Mediastinum/Nodes: No enlarged mediastinal, hilar, or axillary lymph nodes. Thyroid gland, trachea, and esophagus demonstrate no significant findings. Lungs/Pleura: Moderate to marked severity patchy infiltrates are seen throughout both lungs. There is no evidence of a pleural effusion or pneumothorax. Upper Abdomen: A 2.8 cm x 1.8 cm cystic appearing area is seen within the left lobe of the liver. A solitary subcentimeter gallstone is seen within the lumen of an otherwise normal-appearing gallbladder. Musculoskeletal: Multilevel degenerative changes seen throughout the thoracic spine. Review of the MIP images confirms the above findings. IMPRESSION: 1. Mild to moderate severity pulmonary embolism involving multiple middle lobe and lower lobe branches of the right pulmonary artery. 2. Moderate to marked severity patchy bilateral infiltrates. 3. Marked severity coronary artery calcification. 4. Cholelithiasis. 5. 2.8 cm x  1.8 cm cystic appearing area within the left lobe of the liver. Electronically Signed   By: Aram Candela M.D.   On: 07/02/2019 01:08   MR BRAIN WO CONTRAST  Result Date: 07/03/2019 CLINICAL DATA:  85 year old male with altered mental status for 2 days. Positive COVID-19. Encephalopathy. Questionable extra-axial fluid collection over the right convexity on recent noncontrast CT. EXAM: MRI HEAD WITHOUT CONTRAST TECHNIQUE: Multiplanar, multiecho pulse sequences of the brain and surrounding structures were obtained without intravenous contrast. COMPARISON:  Head CT 07/19/2019. FINDINGS: Brain: Mildly asymmetric extra-axial CSF is redemonstrated along the right convexity, but on coronal T2 images there is no evidence of displacement of the dural veins, no subdural collection (series 12, image 17). No restricted diffusion to suggest acute infarction. No midline shift, mass effect, evidence of mass lesion, ventriculomegaly, extra-axial collection or acute intracranial hemorrhage. Cervicomedullary junction and pituitary are within normal limits. Artifact suspected on T2* imaging (series 10, image 11). No convincing cortical encephalomalacia or chronic cerebral blood products. Mild T2 heterogeneity in the bilateral basal ganglia mostly reflects perivascular spaces. Minimal nonspecific T2 and FLAIR hyperintensity. Brainstem and cerebellum appear negative. Vascular: Major intracranial vascular flow voids are preserved. The left vertebral artery appears dominant. Skull and upper cervical spine: Negative visible cervical spine and bone marrow signal. Sinuses/Orbits: Postoperative changes to both globes but otherwise negative orbits. Mild paranasal sinus mucosal thickening. Other: Mastoids are well pneumatized. Visible internal auditory structures appear normal. Scalp and face soft tissues appear negative. IMPRESSION: No acute intracranial abnormality and largely unremarkable for age noncontrast MRI appearance of the  brain. Electronically Signed   By: Odessa Fleming M.D.   On: 07/03/2019 15:45   US Venous Img Lower Bilateral (DVT)  Result  Date: 07/02/2019 CLINICAL DATA:  Pulmonary embolism.  Evaluate for DVT. EXAM: BILATERAL LOWER EXTREMITY VENOUS DOPPLER ULTRASOUND TECHNIQUE: Gray-scale sonography with graded compression, as well as color Doppler and duplex ultrasound were performed to evaluate the lower extremity deep venous systems from the level of the common femoral vein and including the common femoral, femoral, profunda femoral, popliteal and calf veins including the posterior tibial, peroneal and gastrocnemius veins when visible. The superficial great saphenous vein was also interrogated. Spectral Doppler was utilized to evaluate flow at rest and with distal augmentation maneuvers in the common femoral, femoral and popliteal veins. COMPARISON:  None. FINDINGS: RIGHT LOWER EXTREMITY Common Femoral Vein: No evidence of thrombus. Normal compressibility, respiratory phasicity and response to augmentation. Saphenofemoral Junction: No evidence of thrombus. Normal compressibility and flow on color Doppler imaging. Profunda Femoral Vein: No evidence of thrombus. Normal compressibility and flow on color Doppler imaging. Femoral Vein: No evidence of thrombus. Normal compressibility, respiratory phasicity and response to augmentation. Popliteal Vein: No evidence of thrombus. Normal compressibility, respiratory phasicity and response to augmentation. Calf Veins: No evidence of thrombus. Normal compressibility and flow on color Doppler imaging. Superficial Great Saphenous Vein: No evidence of thrombus. Normal compressibility. Other Findings:  None. LEFT LOWER EXTREMITY - evaluation of the left lower extremity is degraded/limited due to patient's inability to tolerate the examination. Common Femoral Vein: No evidence of thrombus. Normal compressibility, respiratory phasicity and response to augmentation. Saphenofemoral Junction: Not  evaluated Profunda Femoral Vein: Not evaluated Femoral Vein: Not evaluated Popliteal Vein: Not evaluated Calf Veins: No evidence of thrombus. Normal compressibility and flow on color Doppler imaging. Superficial Great Saphenous Vein: No evidence of thrombus. Normal compressibility. Other Findings:  None. IMPRESSION: 1. No evidence of DVT within right lower extremity. 2. No evidence of DVT within the left lower extremity, though evaluation degraded/limited secondary to patient's inability to tolerate the examination. Electronically Signed   By: Sandi Mariscal M.D.   On: 07/02/2019 12:26   DG Chest Port 1 View  Result Date: 06/23/2019 CLINICAL DATA:  Shortness of breath for 7 days EXAM: PORTABLE CHEST 1 VIEW COMPARISON:  None. FINDINGS: Mediastinal contour is normal. Heart size is enlarged. There is mild hazy opacity of both lung bases. There is no pulmonary edema. No acute abnormalities identified in the visualized bony structures. IMPRESSION: Mild hazy opacity of both lung bases, developing pneumonia is not excluded. Electronically Signed   By: Abelardo Diesel M.D.   On: 06/23/2019 12:37   [place] EKG SITE RITE  Result Date: 07/01/2019 If Site Rite image not attached, placement could not be confirmed due to current cardiac rhythm.   Roxan Hockey, MD  [facility]  If 7PM-7AM, please contact night-coverage [email or web address]  07/04/2019, 7:42 PM   LOS: 11 days

## 2019-07-04 NOTE — Care Management (Signed)
PASARR; [phone] [id number]

## 2019-07-04 NOTE — Progress Notes (Signed)
Patient alert this shift. Oriented to self only. Cooperative with staff, follows simple one step directions. Placed on 1L, patient 88 to 89% while sleeping. O2 sats improved to 92%

## 2019-07-05 LAB — D-DIMER, QUANTITATIVE: D-Dimer, Quant: 2.97 ug/mL-FEU — ABNORMAL HIGH (ref 0.00–0.50)

## 2019-07-05 LAB — FERRITIN: Ferritin: 52 ng/mL (ref 24–336)

## 2019-07-05 LAB — GLUCOSE, CAPILLARY
Glucose-Capillary: 125 mg/dL — ABNORMAL HIGH (ref 70–99)
Glucose-Capillary: 150 mg/dL — ABNORMAL HIGH (ref 70–99)
Glucose-Capillary: 123 mg/dL — ABNORMAL HIGH (ref 70–99)
Glucose-Capillary: 102 mg/dL — ABNORMAL HIGH (ref 70–99)

## 2019-07-05 LAB — C-REACTIVE PROTEIN: CRP: 1.5 mg/dL — ABNORMAL HIGH (ref <1.0)

## 2019-07-05 NOTE — Plan of Care (Signed)

## 2019-07-05 NOTE — Progress Notes (Signed)
[name]           PROGRESS NOTE  Blake Collins CWC:[id number] DOB: [birth year] DOA: 06/23/2019 PCP: Dione Housekeeper, MD  Brief History: 85 y.o.malewith medical history ofdiabetes mellitus type 2, hypertension, hyperlipidemia, CKD stage III, chronic low back pain, GERD, anxiety presenting with 1 to 2-week history of generalized weakness. The patient has been complaining of some intermittent rectal bleeding for the better part of a week. He denies any hemoptysis, hematemesis, melena, constipation, diarrhea, abdominal pain. He has not had any fevers, chills, chest pain, cough, hemoptysis. The patient does complain of some intermittent shortness of breath over the past 2 to 3 days and worsening generalized weakness. He denies any recent travels, worsening lower extremity edema or increasing abdominal girth or orthopnea. As result, he presented for further evaluation. He denies any falling or syncope. He denies any new medications other than the fact that he received his first COVID-19 vaccination 3 days prior to this admission. He denies any dysuria, hematuria, headache, neck pain, flank pain. He quit smoking 20 years ago after approximately 60pack-year history. Notably, the patient states that his son with whom he lives has had some URI type symptoms. Apparently his son went to get a COVID-19 test, but the results are unknown at this time. In the emergency department, the patient was afebrile hemodynamically stable with oxygen saturation 86% on room air. He was placed on 2 L with oxygen saturation 95%. BMP showed a serum creatinine 1.85 which is above his usual baseline. LFTs were unremarkable. WBC 8.3, hemoglobin 10.0, platelets 277,000. Chest x-ray suggested possible hazy basilar opacities, cardiomegaly.Although initial POC SARS-CoV2 antigen was neg, his RT-PCR came back positive.  Assessment/Plan: Acute respiratory failure with hypoxiadue COVID-19 pneumonia -Likely multifactorial  including underlying COPD,OSA and COVID-19 -Overall hypoxia improved significantly, continue to wean off O2 - procalcitonin0.20 -VOH607 -CRP20.8>>20.2>>>3.9 -Ferritin97>>115>>>80 -D-dimer--3.31>>3.17>>2.54 -EKG shows sinus rhythm, IVCD, nonspecific ST ST wave changes -SARS-CoV2 RT-PCR--positive -He completed his course of remdesivir on 06/27/19 -Overall much improved, no respiratory distress  Symptomatic anemia/rectal bleeding -Review of the medical record shows baseline hemoglobin11-12 -GI consultappreciated--no intervention until recovered from [place] unless becomes unstable -Tolerating solid food -ContinuePPI -Type and screen -Hgb stable--some drop due to dilution  Acute on chronic renal failure--CKD stage IIIb -Baseline creatinine 1.2-1.5 -serum creatinine peaked 1.85 -Secondary to volume depletion --creatinine is now normal with hydration -Holding lisinopril  Pyuria -concerned about UTI -urine culture <10K organisms -d/c ceftriaxone-->had 3 days  Cognitive impairment with delirium/acute metabolic encephalopathy appears to be resolving -possibly has underlying dementia--daughter agrees  -Patient was very functional prior to admission (driving, paying bills) -No further agitation, -Patient is very conversational, coherent and appropriate -Patient has been off restraints for more than 48hours, -Overall much improved, -Previously he did receive Haldol/Ativan as needed for agitation----no longer requiring as needed sedatives -CT head did not show any acute process -ammonia checked and was normal -ABG does not show elevated PCO2 -EEG did not show any epileptiform discharges -MRI brain does not show any evidence of infarct -Mental status overall significantly improved -He has not required any Ativan -Seroquel discontinued  Generalized weakness -Likely multifactorial includingCOVID 19,symptomatic anemia, acute on chronic renal  failure -SerumB12--1289 -folate--15.1 -TSH--1.296 -PT evaluation-->SNF  Diabetes mellitus type 2 -Holding metformin and glipizide -NovoLog sliding scale -06/23/19 A1C--6.4 reflecting excellent DM control PTA  Essential hypertension -Holding lisinopril in the setting of soft blood pressure and acute on chronic renal failure -BP remains well controlled  COPD -started combivent  Hyperlipidemia -Continue statin  Hypomagnesemia/hypophosphatemia -repleted  Acute pulmonary embolus -Noted on CT angiogram of chest -Likely related to COVID-19 -Started on therapeutic dose Lovenox -We will switch to Eliquis once he is reliably taking p.o. -Venous Doppler lower extremity negative for DVT   Disposition Plan: Patient From: Home D/C Place: Skilled nursing facility placement when bed available   barriers:  Mental status-much improved patient is medically ready for discharge if bed available  Family Communication:Daughter updated on phone previously  Consultants:GI  Code Status: FULL   DVT Prophylaxis: Lovenox   Procedures: As Listed in Progress Note Above  Antibiotics: Ceftriaxone 3/28>>>3/30   Subjective: -RN Emily at bedside -Patient is eating and drinking well, conversational coherent and interactive -Joking appropriately  Objective: Vitals:   07/04/19 0931 07/04/19 1500 07/04/19 2218 07/05/19 1654  BP:  127/72 124/68 (!) 110/55  Pulse:  82 81 87  Resp:  18 17 (!) 22  Temp:  99.2 F (37.3 C) 98.1 F (36.7 C) 97.7 F (36.5 C)  TempSrc:  Oral Oral Oral  SpO2: 95% 92% 92% 91%  Weight:      Height:        Intake/Output Summary (Last 24 hours) at 07/05/2019 1906 Last data filed at 07/05/2019 1800 Gross per 24 hour  Intake 240 ml  Output --  Net 240 ml   Weight change:  Exam:  General exam: Alert, awake, very cooperative, not agitated, able to follow commands Respiratory system: Clear to auscultation. Respiratory effort  normal. Cardiovascular system:RRR. No murmurs, rubs, gallops. Gastrointestinal system: Abdomen is nondistended, soft and nontender, Normal bowel sounds heard. Central nervous system: Strength is equal in lower extremities and upper extremities bilaterally. Extremities: No C/C/E, +pedal pulses Skin: No rashes, lesions or ulcers Psychiatry: Patient is more awake, he is not agitated, very cooperative  Data Reviewed: I have personally reviewed following labs and imaging studies Basic Metabolic Panel: Recent Labs  Lab 06/30/19 0649 07/01/19 0616 07/02/19 0510 07/03/19 0539 07/04/19 0528  NA 146* 142 141 142 142  K 4.2 3.9 4.3 4.1 3.9  CL 111 108 108 108 108  CO2 26 24 23 23 25   GLUCOSE 153* 128* 100* 100* 135*  BUN 29* 25* 22 29* 29*  CREATININE 1.04 1.08 1.05 1.33* 1.24  CALCIUM 8.5* 8.4* 8.5* 8.3* 8.3*   Liver Function Tests: Recent Labs  Lab 06/29/19 0607  AST 123*  ALT 66*  ALKPHOS 57  BILITOT 0.8  PROT 6.0*  ALBUMIN 2.5*   No results for input(s): LIPASE, AMYLASE in the last 168 hours. Recent Labs  Lab 06/29/19 0618  AMMONIA 10   Coagulation Profile: No results for input(s): INR, PROTIME in the last 168 hours. CBC: Recent Labs  Lab 06/29/19 0607 06/30/19 0649 07/01/19 0616 07/02/19 0510 07/03/19 0539  WBC 11.3* 11.6* 12.3* 8.8 6.9  HGB 9.8* 10.4* 11.0* 11.4* 11.1*  HCT 32.2* 35.4* 37.2* 37.9* 36.8*  MCV 85.0 85.9 85.3 83.7 85.6  PLT 360 353 302 257 212   Cardiac Enzymes: No results for input(s): CKTOTAL, CKMB, CKMBINDEX, TROPONINI in the last 168 hours. BNP: Invalid input(s): POCBNP CBG: Recent Labs  Lab 07/04/19 1715 07/04/19 2209 07/05/19 0758 07/05/19 1121 07/05/19 1624  GLUCAP 130* 111* 125* 150* 123*   HbA1C: No results for input(s): HGBA1C in the last 72 hours. Urine analysis:    Component Value Date/Time   COLORURINE YELLOW 06/28/2019 2045   APPEARANCEUR CLEAR 06/28/2019 2045   LABSPEC 1.018 06/28/2019 2045   PHURINE 5.0  06/28/2019 2045   GLUCOSEU NEGATIVE  06/28/2019 2045   HGBUR LARGE (A) 06/28/2019 2045   BILIRUBINUR NEGATIVE 06/28/2019 2045   KETONESUR 5 (A) 06/28/2019 2045   PROTEINUR 30 (A) 06/28/2019 2045   UROBILINOGEN 0.2 02/17/2011 1334   NITRITE NEGATIVE 06/28/2019 2045   LEUKOCYTESUR NEGATIVE 06/28/2019 2045   Sepsis Labs: @LABRCNTIP (procalcitonin:4,lacticidven:4) ) Recent Results (from the past 240 hour(s))  Culture, Urine     Status: None   Collection Time: 06/28/19  8:45 PM   Specimen: Urine, Catheterized  Result Value Ref Range Status   Specimen Description   Final    URINE, CATHETERIZED Performed at [facility], [address] [place], [place]    Special Requests   Final    NONE Performed at [facility], [address] [place], [place]    Culture   Final    NO GROWTH Performed at [facility], [address] [place], [place]    Report Status 06/30/2019 FINAL  Final     Scheduled Meds: . atorvastatin  20 mg Oral q morning - 10a  . enoxaparin (LOVENOX) injection  100 mg Subcutaneous Q12H  . insulin aspart  0-9 Units Subcutaneous TID WC  . pantoprazole  40 mg Oral Daily  . zinc sulfate  220 mg Oral Daily   Continuous Infusions: . dextrose 5 % and 0.45 % NaCl with KCl 20 mEq/L 100 mL/hr at 07/05/19 1710    Procedures/Studies: EEG  Result Date: 07/02/2019 09/01/2019, MD     07/02/2019 12:18 PM Patient Name: ARUL FARABEE MRN: Jerilynn Mages Epilepsy Attending: [id number] Referring Physician/Provider: Dr. Charlsie Quest Date: 07/02/2019 Duration: 23.36 minutes. Patient history: 85 year old male with history of dementia now admitted with COVID-19 infection.  EEG to evaluate for altered mental status and seizures. Level of alertness: Lethargic AEDs during EEG study: Ativan Technical aspects: This EEG study was done with scalp electrodes positioned according to the 10-20 International system of electrode placement. Electrical activity was  acquired at a sampling rate of 500Hz  and reviewed with a high frequency filter of 70Hz  and a low frequency filter of 1Hz . EEG data were recorded continuously and digitally stored. Description: No clear posterior dominant was seen.  EEG showed continuous generalized amplitude 3 to 5 Hz theta-delta slowing. Hyperventilation and photic stimulation were not performed. Abnormality - Continuous slow, generalized IMPRESSION: This study is suggestive of severe diffuse encephalopathy, nonspecific etiology. No seizures or epileptiform discharges were seen throughout the recording. 97   CT HEAD WO CONTRAST  Result Date: 06/29/2019 CLINICAL DATA:  Encephalopathy. Additional history provided: Combative, restless, cognitive impairment with delirium, fever. COVID positive. History of diabetes mellitus, hypertension. EXAM: CT HEAD WITHOUT CONTRAST TECHNIQUE: Contiguous axial images were obtained from the base of the skull through the vertex without intravenous contrast. COMPARISON:  No pertinent prior studies available for comparison. FINDINGS: Brain: There is no evidence of acute intracranial hemorrhage or intracranial mass. No midline shift.No demarcated cortical infarction. A small hypodense extra-axial subdural collection is questioned overlying the right cerebral convexity, measuring up to 6 mm (series 4, image 27). Moderate generalized parenchymal atrophy. Vascular: No hyperdense vessel. Atherosclerotic calcifications Skull: Normal. Negative for fracture or focal lesion. Sinuses/Orbits: Visualized orbits demonstrate no acute abnormality. Mild ethmoid and right maxillary sinus mucosal thickening. Small left maxillary sinus mucous retention cyst. No significant mastoid effusion. Other: Small metallic foreign body within the scalp or possibly imbedded within the superficial aspect of the left frontal calvarium. IMPRESSION: No acute intracranial hemorrhage or  demarcated cortical infarct. Questionable small  hypodense extra-axial subdural collection measuring up to 6 mm in thickness overlying the right cerebral convexity. This could reflect a hygroma or chronic subdural hematoma. No midline shift. Moderate generalized parenchymal atrophy. Mild paranasal sinus mucosal thickening. Left maxillary sinus mucous retention cyst. Electronically Signed   By: Jackey Loge DO   On: 06/29/2019 18:08   CT ANGIO CHEST PE W OR WO CONTRAST  Result Date: 07/02/2019 CLINICAL DATA:  Intermittent shortness of breath times 2-3 days. EXAM: CT ANGIOGRAPHY CHEST WITH CONTRAST TECHNIQUE: Multidetector CT imaging of the chest was performed using the standard protocol during bolus administration of intravenous contrast. Multiplanar CT image reconstructions and MIPs were obtained to evaluate the vascular anatomy. CONTRAST:  OMNIPAQUE IOHEXOL 350 MG/ML SOLN COMPARISON:  None. FINDINGS: Cardiovascular: Mild intraluminal filling defects are seen involving multiple middle lobe and lower lobe branches of the right pulmonary artery. Normal heart size. No pericardial effusion. Marked severity coronary artery calcification is seen. Mediastinum/Nodes: No enlarged mediastinal, hilar, or axillary lymph nodes. Thyroid gland, trachea, and esophagus demonstrate no significant findings. Lungs/Pleura: Moderate to marked severity patchy infiltrates are seen throughout both lungs. There is no evidence of a pleural effusion or pneumothorax. Upper Abdomen: A 2.8 cm x 1.8 cm cystic appearing area is seen within the left lobe of the liver. A solitary subcentimeter gallstone is seen within the lumen of an otherwise normal-appearing gallbladder. Musculoskeletal: Multilevel degenerative changes seen throughout the thoracic spine. Review of the MIP images confirms the above findings. IMPRESSION: 1. Mild to moderate severity pulmonary embolism involving multiple middle lobe and lower lobe branches of the right pulmonary artery. 2. Moderate to marked severity patchy  bilateral infiltrates. 3. Marked severity coronary artery calcification. 4. Cholelithiasis. 5. 2.8 cm x 1.8 cm cystic appearing area within the left lobe of the liver. Electronically Signed   By: Aram Candela M.D.   On: 07/02/2019 01:08   MR BRAIN WO CONTRAST  Result Date: 07/03/2019 CLINICAL DATA:  85 year old male with altered mental status for 2 days. Positive COVID-19. Encephalopathy. Questionable extra-axial fluid collection over the right convexity on recent noncontrast CT. EXAM: MRI HEAD WITHOUT CONTRAST TECHNIQUE: Multiplanar, multiecho pulse sequences of the brain and surrounding structures were obtained without intravenous contrast. COMPARISON:  Head CT 07/19/2019. FINDINGS: Brain: Mildly asymmetric extra-axial CSF is redemonstrated along the right convexity, but on coronal T2 images there is no evidence of displacement of the dural veins, no subdural collection (series 12, image 17). No restricted diffusion to suggest acute infarction. No midline shift, mass effect, evidence of mass lesion, ventriculomegaly, extra-axial collection or acute intracranial hemorrhage. Cervicomedullary junction and pituitary are within normal limits. Artifact suspected on T2* imaging (series 10, image 11). No convincing cortical encephalomalacia or chronic cerebral blood products. Mild T2 heterogeneity in the bilateral basal ganglia mostly reflects perivascular spaces. Minimal nonspecific T2 and FLAIR hyperintensity. Brainstem and cerebellum appear negative. Vascular: Major intracranial vascular flow voids are preserved. The left vertebral artery appears dominant. Skull and upper cervical spine: Negative visible cervical spine and bone marrow signal. Sinuses/Orbits: Postoperative changes to both globes but otherwise negative orbits. Mild paranasal sinus mucosal thickening. Other: Mastoids are well pneumatized. Visible internal auditory structures appear normal. Scalp and face soft tissues appear negative. IMPRESSION:  No acute intracranial abnormality and largely unremarkable for age noncontrast MRI appearance of the brain. Electronically Signed   By: Odessa Fleming M.D.   On: 07/03/2019 15:45   US Venous Img Lower Bilateral (DVT)  Result  Date: 07/02/2019 CLINICAL DATA:  Pulmonary embolism.  Evaluate for DVT. EXAM: BILATERAL LOWER EXTREMITY VENOUS DOPPLER ULTRASOUND TECHNIQUE: Gray-scale sonography with graded compression, as well as color Doppler and duplex ultrasound were performed to evaluate the lower extremity deep venous systems from the level of the common femoral vein and including the common femoral, femoral, profunda femoral, popliteal and calf veins including the posterior tibial, peroneal and gastrocnemius veins when visible. The superficial great saphenous vein was also interrogated. Spectral Doppler was utilized to evaluate flow at rest and with distal augmentation maneuvers in the common femoral, femoral and popliteal veins. COMPARISON:  None. FINDINGS: RIGHT LOWER EXTREMITY Common Femoral Vein: No evidence of thrombus. Normal compressibility, respiratory phasicity and response to augmentation. Saphenofemoral Junction: No evidence of thrombus. Normal compressibility and flow on color Doppler imaging. Profunda Femoral Vein: No evidence of thrombus. Normal compressibility and flow on color Doppler imaging. Femoral Vein: No evidence of thrombus. Normal compressibility, respiratory phasicity and response to augmentation. Popliteal Vein: No evidence of thrombus. Normal compressibility, respiratory phasicity and response to augmentation. Calf Veins: No evidence of thrombus. Normal compressibility and flow on color Doppler imaging. Superficial Great Saphenous Vein: No evidence of thrombus. Normal compressibility. Other Findings:  None. LEFT LOWER EXTREMITY - evaluation of the left lower extremity is degraded/limited due to patient's inability to tolerate the examination. Common Femoral Vein: No evidence of thrombus. Normal  compressibility, respiratory phasicity and response to augmentation. Saphenofemoral Junction: Not evaluated Profunda Femoral Vein: Not evaluated Femoral Vein: Not evaluated Popliteal Vein: Not evaluated Calf Veins: No evidence of thrombus. Normal compressibility and flow on color Doppler imaging. Superficial Great Saphenous Vein: No evidence of thrombus. Normal compressibility. Other Findings:  None. IMPRESSION: 1. No evidence of DVT within right lower extremity. 2. No evidence of DVT within the left lower extremity, though evaluation degraded/limited secondary to patient's inability to tolerate the examination. Electronically Signed   By: Simonne ComeJohn  Watts M.D.   On: 07/02/2019 12:26   DG Chest Port 1 View  Result Date: 06/23/2019 CLINICAL DATA:  Shortness of breath for 7 days EXAM: PORTABLE CHEST 1 VIEW COMPARISON:  None. FINDINGS: Mediastinal contour is normal. Heart size is enlarged. There is mild hazy opacity of both lung bases. There is no pulmonary edema. No acute abnormalities identified in the visualized bony structures. IMPRESSION: Mild hazy opacity of both lung bases, developing pneumonia is not excluded. Electronically Signed   By: Sherian ReinWei-Chen  Lin M.D.   On: 06/23/2019 12:37   US EKG SITE RITE  Result Date: 07/01/2019 If Site Rite image not attached, placement could not be confirmed due to current cardiac rhythm.   Shon Haleourage Chrystle Murillo, MD  [facility]  If 7PM-7AM, please contact night-coverage [email or web address]  07/05/2019, 7:06 PM   LOS: 12 days

## 2019-07-05 NOTE — NC FL2 (Signed)
[place] MEDICAID FL2 LEVEL OF CARE SCREENING TOOL     IDENTIFICATION  Patient Name: Blake Collins Birthdate: 14-Mar-1935 Sex: male Admission Date (Current Location): 06/23/2019  [facility] and [place] Number:  [employer] and Address:  [facility],  [address] [place] [address]      Provider Number: [id number]  Attending Physician Name and Address:  Shon Hale, MD  Relative Name and Phone Number:  [name] (daughter) [facility]: [phone]    Current Level of Care: Hospital Recommended Level of Care: Skilled Nursing Facility Prior Approval Number:    Date Approved/Denied:   PASRR Number: [id number]  Discharge Plan: SNF    Current Diagnoses: Patient Active Problem List   Diagnosis Date Noted  . Pulmonary embolism (HCC) 07/02/2019  . Acute respiratory failure due to COVID-19 (HCC) 06/24/2019  . Symptomatic anemia 06/24/2019  . Acute respiratory failure with hypoxia (HCC) 06/23/2019  . Generalized weakness 06/23/2019  . Acute renal failure superimposed on stage 3b chronic kidney disease (HCC) 06/23/2019  . Lower GI bleed 06/23/2019  . Anemia     Orientation RESPIRATION BLADDER Height & Weight        O2(2L) Incontinent Weight: 230 lb (104.3 kg) Height:  5\' 10"  (177.8 cm)  BEHAVIORAL SYMPTOMS/MOOD NEUROLOGICAL BOWEL NUTRITION STATUS  Other (Comment)(Confused, refusing. Per family he is A&O at baseline.)   Incontinent Diet(heart healthy)  AMBULATORY STATUS COMMUNICATION OF NEEDS Skin   Limited Assist Verbally Normal                       Personal Care Assistance Level of Assistance  Bathing, Dressing, Feeding Bathing Assistance: Limited assistance Feeding assistance: Limited assistance Dressing Assistance: Limited assistance     Functional Limitations Info  Sight, Speech, Hearing Sight Info: Adequate Hearing Info: Adequate Speech Info: Adequate    SPECIAL CARE FACTORS FREQUENCY  PT (By licensed PT)     PT Frequency:  5x/week              Contractures Contractures Info: Not present    Additional Factors Info  Code Status, Allergies, Psychotropic Code Status Info: Full Code Allergies Info: NKA Psychotropic Info: Klonopin         Current Medications (07/05/2019):  This is the current hospital active medication list Current Facility-Administered Medications  Medication Dose Route Frequency Provider Last Rate Last Admin  . acetaminophen (TYLENOL) tablet 650 mg  650 mg Oral Q6H PRN 09/04/2019, MD       Or  . acetaminophen (TYLENOL) suppository 650 mg  650 mg Rectal Q6H PRN Erick Blinks, MD   650 mg at 07/02/19 1812  . atorvastatin (LIPITOR) tablet 20 mg  20 mg Oral q morning - 10a 09/01/19, MD   20 mg at 07/05/19 0905  . dextrose 5 % and 0.45 % NaCl with KCl 20 mEq/L infusion   Intravenous Continuous 09/04/19, MD 100 mL/hr at 07/04/19 1519 New Bag at 07/04/19 1519  . diphenhydrAMINE (BENADRYL) injection 25 mg  25 mg Intravenous Q6H PRN 09/03/19, MD   25 mg at 07/02/19 2122  . enoxaparin (LOVENOX) injection 100 mg  100 mg Subcutaneous Q12H 2123, MD   100 mg at 07/05/19 0645  . hydrocortisone cream 1 %   Topical BID PRN 09/04/19, MD   Given at 06/29/19 0507  . insulin aspart (novoLOG) injection 0-9 Units  0-9 Units Subcutaneous TID WC 08/29/19, MD   1 Units at 07/05/19  1231  . Ipratropium-Albuterol (COMBIVENT) respimat 1 puff  1 puff Inhalation Q6H PRN Kathie Dike, MD      . liver oil-zinc oxide (DESITIN) 40 % ointment   Topical Daily PRN Darrick Meigs, Marge Duncans, MD      . ondansetron (ZOFRAN) tablet 4 mg  4 mg Oral Q6H PRN Tat, David, MD       Or  . ondansetron (ZOFRAN) injection 4 mg  4 mg Intravenous Q6H PRN Tat, David, MD      . pantoprazole (PROTONIX) EC tablet 40 mg  40 mg Oral Daily Tat, Shanon Brow, MD   40 mg at 07/05/19 0905  . zinc sulfate capsule 220 mg  220 mg Oral Daily Tat, David, MD   220 mg at 07/05/19 1610     Discharge Medications: Please see  discharge summary for a list of discharge medications.  Relevant Imaging Results:  Relevant Lab Results:   Additional Information SSN [id number]. COVID + on 06/23/19  Sherie Don, LCSW

## 2019-07-05 NOTE — Progress Notes (Signed)
Physical Therapy Treatment Patient Details Name: Blake Collins MRN: [id number] DOB: 12-14-1934 Today's Date: 07/05/2019    History of Present Illness Blake Collins is a 85 y.o. male with medical history of diabetes mellitus type 2, hypertension, hyperlipidemia, CKD stage III, chronic low back pain, GERD, anxiety presenting with 1 to 2-week history of generalized weakness.  The patient has been complaining of some intermittent rectal bleeding for the better part of a week.  He denies any hemoptysis, hematemesis, melena, constipation, diarrhea, abdominal pain.  He has not had any fevers, chills, chest pain, cough, hemoptysis.The patient does complain of some intermittent shortness of breath over the past 2 to 3 days and worsening generalized weakness.  He denies any recent travels, worsening lower extremity edema or increasing abdominal girth or orthopnea.  As result, he presented for further evaluation.  He denies any falling or syncope.  He denies any new medications other than the fact that he received his first COVID-19 vaccination 3 days prior to this admission.  He denies any dysuria, hematuria, headache, neck pain, flank pain.  He quit smoking 20 years ago after approximately 60 pack-year history.  Notably, the patient states that his son with whom he lives has had some URI type symptoms.  Apparently his son went to get a COVID-19 test, but the results are unknown at this time.    PT Comments    Patient present alert and cooperative with mentation near normal, but very HOH.  Patient demonstrates slow labored movement for sitting up at bedside, required use of RW to complete sit to stands and transfers due to BLE weakness, able to take a few steps at bedside without loss of balance, limited secondary to c/o fatigue and tolerated sitting up in chair to eat breakfast after therapy - RN aware.  Patient will benefit from continued physical therapy in hospital and recommended venue below to increase  strength, balance, endurance for safe ADLs and gait.    Follow Up Recommendations  SNF     Equipment Recommendations  None recommended by PT    Recommendations for Other Services       Precautions / Restrictions Precautions Precautions: Fall Restrictions Weight Bearing Restrictions: No    Mobility  Bed Mobility Overal bed mobility: Needs Assistance Bed Mobility: Supine to Sit     Supine to sit: Min assist;Mod assist     General bed mobility comments: increased time, labored movement  Transfers Overall transfer level: Needs assistance Equipment used: Rolling walker (2 wheeled) Transfers: Sit to/from [facility] Sit to Stand: Min assist Stand pivot transfers: Min assist;Mod assist       General transfer comment: slow labored movement  Ambulation/Gait Ambulation/Gait assistance: Mod assist Gait Distance (Feet): 10 Feet Assistive device: Rolling walker (2 wheeled) Gait Pattern/deviations: Decreased step length - right;Decreased step length - left;Decreased stride length Gait velocity: decreased   General Gait Details: limited to taking steps at bedside with slow labored unsteady cadence requiring use of RW to maintain standing balance, limited secondary to c/o fatigue   Stairs             Wheelchair Mobility    Modified Rankin (Stroke Patients Only)       Balance Overall balance assessment: Needs assistance Sitting-balance support: Feet supported;No upper extremity supported Sitting balance-Leahy Scale: Fair Sitting balance - Comments: fair/good seated at EOB   Standing balance support: During functional activity;Bilateral upper extremity supported Standing balance-Leahy Scale: Fair Standing balance comment: using RW  Cognition Arousal/Alertness: Awake/alert Behavior During Therapy: WFL for tasks assessed/performed Overall Cognitive Status: Within Functional Limits for tasks assessed                                         Exercises General Exercises - Lower Extremity Ankle Circles/Pumps: Seated;AROM;Strengthening;Both;10 reps Long Arc Quad: Seated;AROM;Strengthening;Both;10 reps Hip Flexion/Marching: Seated;AROM;Strengthening;Both;10 reps    General Comments        Pertinent Vitals/Pain Pain Assessment: No/denies pain    Home Living                      Prior Function            PT Goals (current goals can now be found in the care plan section) Acute Rehab PT Goals Patient Stated Goal: return home PT Goal Formulation: With patient Time For Goal Achievement: 07/19/19 Potential to Achieve Goals: Good Progress towards PT goals: Progressing toward goals    Frequency    Min 3X/week      PT Plan Current plan remains appropriate    Co-evaluation              AM-PAC PT "6 Clicks" Mobility   Outcome Measure  Help needed turning from your back to your side while in a flat bed without using bedrails?: A Little Help needed moving from lying on your back to sitting on the side of a flat bed without using bedrails?: A Lot Help needed moving to and from a bed to a chair (including a wheelchair)?: A Lot Help needed standing up from a chair using your arms (e.g., wheelchair or bedside chair)?: A Lot Help needed to walk in hospital room?: A Lot Help needed climbing 3-5 steps with a railing? : A Lot 6 Click Score: 13    End of Session   Activity Tolerance: Patient tolerated treatment well;Patient limited by fatigue Patient left: in chair;with call bell/phone within reach;with chair alarm set;with nursing/sitter in room Nurse Communication: Mobility status PT Visit Diagnosis: Unsteadiness on feet (R26.81);Other abnormalities of gait and mobility (R26.89);Muscle weakness (generalized) (M62.81)     Time: 5397-6734 PT Time Calculation (min) (ACUTE ONLY): 36 min  Charges:  $Therapeutic Exercise: 8-22 mins $Therapeutic Activity:  23-37 mins                     9:26 AM, 07/05/19 Blake Collins, MPT Physical Therapist with [facility] [phone] office [phone] mobile phone

## 2019-07-05 NOTE — TOC Progression Note (Signed)
Transition of Care [facility]) - Progression Note    Patient Details  Name: Blake Collins MRN: [id number] Date of Birth: [birth year]  [facility] CM/SW Contact  Ewing Schlein, LCSW Phone Number: 07/05/2019, 4:09 PM  Clinical Narrative: CSW called Traci with [facility] to see if patient could be accepted for a COVID+ bed. Traci informed CSW that patient would not be accepted at the facility. Patient was declined by [facility] and [facility] as these facilities are no longer taking COVID+ patients and [name] is out of network for [employer]. TOC called Traci and asked her to review the patient for placement again.   CSW called patient's daughter, [name], to discuss other options with her and her brother, [name], if a bed is not available locally. Family does not want patient sent to the other SNFs as these are too far away. Discussed HH as an option, but explained it would be limited in services and a company may not want to come in the home until the patient is considered COVID-free. Patient's son stated he can pick up the patient tomorrow if a bed is not found.   New FL2 completed and signed. Fransico Him called to start insurance authorization.  Expected Discharge Plan and Services   Expected Discharge Date: 07/05/19      Readmission Risk Interventions No flowsheet data found.

## 2019-07-05 NOTE — Care Management Important Message (Signed)
Important Message  Patient Details  Name: Blake Collins MRN: [id number] Date of Birth: 26-Jul-1934   Medicare Important Message Given:  Yes - Important Message mailed due to current National Emergency     Corey Harold 07/05/2019, 1:09 PM

## 2019-07-06 LAB — FERRITIN: Ferritin: 58 ng/mL (ref 24–336)

## 2019-07-06 LAB — GLUCOSE, CAPILLARY
Glucose-Capillary: 117 mg/dL — ABNORMAL HIGH (ref 70–99)
Glucose-Capillary: 120 mg/dL — ABNORMAL HIGH (ref 70–99)
Glucose-Capillary: 103 mg/dL — ABNORMAL HIGH (ref 70–99)
Glucose-Capillary: 98 mg/dL (ref 70–99)

## 2019-07-06 LAB — CBC
HCT: 34.6 % — ABNORMAL LOW (ref 39.0–52.0)
Hemoglobin: 10.5 g/dL — ABNORMAL LOW (ref 13.0–17.0)
MCH: 26.1 pg (ref 26.0–34.0)
MCHC: 30.3 g/dL (ref 30.0–36.0)
MCV: 86.1 fL (ref 80.0–100.0)
Platelets: 141 10*3/uL — ABNORMAL LOW (ref 150–400)
RBC: 4.02 MIL/uL — ABNORMAL LOW (ref 4.22–5.81)
RDW: 16.6 % — ABNORMAL HIGH (ref 11.5–15.5)
WBC: 7.6 10*3/uL (ref 4.0–10.5)
nRBC: 0 % (ref 0.0–0.2)

## 2019-07-06 LAB — C-REACTIVE PROTEIN: CRP: 2.1 mg/dL — ABNORMAL HIGH (ref <1.0)

## 2019-07-06 LAB — D-DIMER, QUANTITATIVE: D-Dimer, Quant: 7.25 ug/mL-FEU — ABNORMAL HIGH (ref 0.00–0.50)

## 2019-07-06 MED ORDER — FLUCONAZOLE 100 MG PO TABS
200.0000 mg | ORAL_TABLET | Freq: Once | ORAL | Status: AC
  Administered 2019-07-06: 200 mg via ORAL
  Filled 2019-07-06: qty 2

## 2019-07-06 NOTE — Progress Notes (Signed)
Blake Collins Kitchen           PROGRESS NOTE  Blake Collins CWC:[id number] DOB: [birth year] DOA: 06/23/2019 PCP: Dione Housekeeper, MD  Brief History: 85 y.o.malewith medical history ofdiabetes mellitus type 2, hypertension, hyperlipidemia, CKD stage III, chronic low back pain, GERD, anxiety presenting with 1 to 2-week history of generalized weakness. The patient has been complaining of some intermittent rectal bleeding for the better part of a week. He denies any hemoptysis, hematemesis, melena, constipation, diarrhea, abdominal pain. He has not had any fevers, chills, chest pain, cough, hemoptysis. The patient does complain of some intermittent shortness of breath over the past 2 to 3 days and worsening generalized weakness. He denies any recent travels, worsening lower extremity edema or increasing abdominal girth or orthopnea. As result, he presented for further evaluation. He denies any falling or syncope. He denies any new medications other than the fact that he received his first COVID-19 vaccination 3 days prior to this admission. He denies any dysuria, hematuria, headache, neck pain, flank pain. He quit smoking 20 years ago after approximately 60pack-year history. Notably, the patient states that his son with whom he lives has had some URI type symptoms. Apparently his son went to get a COVID-19 test, but the results are unknown at this time. In the emergency department, the patient was afebrile hemodynamically stable with oxygen saturation 86% on room air. He was placed on 2 L with oxygen saturation 95%. BMP showed a serum creatinine 1.85 which is above his usual baseline. LFTs were unremarkable. WBC 8.3, hemoglobin 10.0, platelets 277,000. Chest x-ray suggested possible hazy basilar opacities, cardiomegaly.Although initial POC SARS-CoV2 antigen was neg, his RT-PCR came back positive.  Assessment/Plan: Acute respiratory failure with hypoxiadue COVID-19 pneumonia -Likely multifactorial  including underlying COPD,OSA and COVID-19 -Overall hypoxia improved significantly, continue to wean off O2 - procalcitonin0.20 -VOH607 -CRP20.8>>20.2>>>3.9 -Ferritin97>>115>>>80 -D-dimer--3.31>>3.17>>2.54 -EKG shows sinus rhythm, IVCD, nonspecific ST ST wave changes -SARS-CoV2 RT-PCR--positive -He completed his course of remdesivir on 06/27/19 -Overall much improved, no respiratory distress  Symptomatic anemia/rectal bleeding -Review of the medical record shows baseline hemoglobin11-12 -GI consultappreciated--no intervention until recovered from [place] unless becomes unstable -Tolerating solid food -ContinuePPI -Type and screen -Hgb stable--some drop due to dilution  Acute on chronic renal failure--CKD stage IIIb -Baseline creatinine 1.2-1.5 -serum creatinine peaked 1.85 -Secondary to volume depletion --creatinine is now normal with hydration -Holding lisinopril  Pyuria -concerned about UTI -urine culture <10K organisms -d/c ceftriaxone-->had 3 days  Cognitive impairment with delirium/acute metabolic encephalopathy appears to be resolving -possibly has underlying dementia--daughter agrees  -Patient was very functional prior to admission (driving, paying bills) -No further agitation, -Patient is very conversational, coherent and appropriate -Patient has been off restraints for more than 48hours, -Overall much improved, -Previously he did receive Haldol/Ativan as needed for agitation----no longer requiring as needed sedatives -CT head did not show any acute process -ammonia checked and was normal -ABG does not show elevated PCO2 -EEG did not show any epileptiform discharges -MRI brain does not show any evidence of infarct -Mental status overall significantly improved -He has not required any Ativan -Seroquel discontinued  Generalized weakness -Likely multifactorial includingCOVID 19,symptomatic anemia, acute on chronic renal  failure -SerumB12--1289 -folate--15.1 -TSH--1.296 -PT evaluation-->SNF  Diabetes mellitus type 2 -Holding metformin and glipizide -NovoLog sliding scale -06/23/19 A1C--6.4 reflecting excellent DM control PTA  Essential hypertension -Holding lisinopril in the setting of soft blood pressure and acute on chronic renal failure -BP remains well controlled  COPD -started combivent  Hyperlipidemia -Continue statin  Hypomagnesemia/hypophosphatemia -repleted  Acute pulmonary embolus -Noted on CT angiogram of chest -Likely related to COVID-19 -Started on therapeutic dose Lovenox -We will switch to Eliquis once he is reliably taking p.o. -Venous Doppler lower extremity negative for DVT   Disposition Plan: Patient From: Home D/C Place: Skilled nursing facility placement when bed available   barriers:  Mental status-much improved patient is medically ready for discharge if bed available --Patient remains medically stable for transfer to SNF rehab when insurance authorization is available  Family Communication:Daughter updated on phone previously  Consultants:GI  Code Status: FULL   DVT Prophylaxis: Lovenox   Procedures: As Listed in Progress Note Above  Antibiotics: Ceftriaxone 3/28>>>3/30   Subjective: --Patient remains medically stable for transfer to SNF rehab when insurance authorization is available  -Had one episode of loose stool today -Patient son visited  Objective: Vitals:   07/05/19 1654 07/05/19 2046 07/06/19 0544 07/06/19 1617  BP: (!) 110/55 120/71 105/69 121/68  Pulse: 87 86 77 90  Resp: (!) 22 17 16 18   Temp: 97.7 F (36.5 C) 99.3 F (37.4 C) (!) 97.3 F (36.3 C) 98.3 F (36.8 C)  TempSrc: Oral Axillary Axillary Oral  SpO2: 91% 93% 92% 92%  Weight:      Height:        Intake/Output Summary (Last 24 hours) at 07/06/2019 1924 Last data filed at 07/06/2019 0200 Gross per 24 hour  Intake 4670.01 ml  Output --  Net  4670.01 ml   Weight change:  Exam:  General exam: Alert, awake, very cooperative, not agitated, able to follow commands Respiratory system: Clear to auscultation. Respiratory effort normal. Cardiovascular system:RRR. No murmurs, rubs, gallops. Gastrointestinal system: Abdomen is nondistended, soft and nontender, Normal bowel sounds heard. Central nervous system: Strength is equal in lower extremities and upper extremities bilaterally. Extremities: No C/C/E, +pedal pulses Skin: No rashes, lesions or ulcers Psychiatry: Patient is more awake, he is not agitated, very cooperative  Data Reviewed: I have personally reviewed following labs and imaging studies Basic Metabolic Panel: Recent Labs  Lab 06/30/19 0649 07/01/19 0616 07/02/19 0510 07/03/19 0539 07/04/19 0528  NA 146* 142 141 142 142  K 4.2 3.9 4.3 4.1 3.9  CL 111 108 108 108 108  CO2 26 24 23 23 25   GLUCOSE 153* 128* 100* 100* 135*  BUN 29* 25* 22 29* 29*  CREATININE 1.04 1.08 1.05 1.33* 1.24  CALCIUM 8.5* 8.4* 8.5* 8.3* 8.3*   Liver Function Tests: No results for input(s): AST, ALT, ALKPHOS, BILITOT, PROT, ALBUMIN in the last 168 hours. No results for input(s): LIPASE, AMYLASE in the last 168 hours. No results for input(s): AMMONIA in the last 168 hours. Coagulation Profile: No results for input(s): INR, PROTIME in the last 168 hours. CBC: Recent Labs  Lab 06/30/19 0649 07/01/19 0616 07/02/19 0510 07/03/19 0539 07/06/19 0513  WBC 11.6* 12.3* 8.8 6.9 7.6  HGB 10.4* 11.0* 11.4* 11.1* 10.5*  HCT 35.4* 37.2* 37.9* 36.8* 34.6*  MCV 85.9 85.3 83.7 85.6 86.1  PLT 353 302 257 212 141*   Cardiac Enzymes: No results for input(s): CKTOTAL, CKMB, CKMBINDEX, TROPONINI in the last 168 hours. BNP: Invalid input(s): POCBNP CBG: Recent Labs  Lab 07/05/19 1624 07/05/19 2039 07/06/19 0816 07/06/19 1150 07/06/19 1630  GLUCAP 123* 102* 117* 120* 103*   HbA1C: No results for input(s): HGBA1C in the last 72  hours. Urine analysis:    Component Value Date/Time   COLORURINE YELLOW 06/28/2019 2045   APPEARANCEUR CLEAR 06/28/2019 2045  LABSPEC 1.018 06/28/2019 2045   PHURINE 5.0 06/28/2019 2045   GLUCOSEU NEGATIVE 06/28/2019 2045   HGBUR LARGE (A) 06/28/2019 2045   BILIRUBINUR NEGATIVE 06/28/2019 2045   KETONESUR 5 (A) 06/28/2019 2045   PROTEINUR 30 (A) 06/28/2019 2045   UROBILINOGEN 0.2 02/17/2011 1334   NITRITE NEGATIVE 06/28/2019 2045   LEUKOCYTESUR NEGATIVE 06/28/2019 2045   Sepsis Labs: @LABRCNTIP (procalcitonin:4,lacticidven:4) ) Recent Results (from the past 240 hour(s))  Culture, Urine     Status: None   Collection Time: 06/28/19  8:45 PM   Specimen: Urine, Catheterized  Result Value Ref Range Status   Specimen Description   Final    URINE, CATHETERIZED Performed at [facility], [address] [place], [place]    Special Requests   Final    NONE Performed at [facility], [address], [place], [place]    Culture   Final    NO GROWTH Performed at [facility], [address], [place], [place]    Report Status 06/30/2019 FINAL  Final     Scheduled Meds: . atorvastatin  20 mg Oral q morning - 10a  . enoxaparin (LOVENOX) injection  100 mg Subcutaneous Q12H  . insulin aspart  0-9 Units Subcutaneous TID WC  . pantoprazole  40 mg Oral Daily  . zinc sulfate  220 mg Oral Daily   Continuous Infusions: . dextrose 5 % and 0.45 % NaCl with KCl 20 mEq/L 10 mL/hr at 07/06/19 09/05/19    Procedures/Studies: EEG  Result Date: 07/02/2019 09/01/2019, MD     07/02/2019 12:18 PM Patient Name: ALEXANDERJAMES BERG MRN: Jerilynn Mages Epilepsy Attending: [id number] Referring Physician/Provider: Dr. Charlsie Quest Date: 07/02/2019 Duration: 23.36 minutes. Patient history: 85 year old male with history of dementia now admitted with COVID-19 infection.  EEG to evaluate for altered mental status and seizures. Level of alertness: Lethargic AEDs during EEG  study: Ativan Technical aspects: This EEG study was done with scalp electrodes positioned according to the 10-20 International system of electrode placement. Electrical activity was acquired at a sampling rate of 500Hz  and reviewed with a high frequency filter of 70Hz  and a low frequency filter of 1Hz . EEG data were recorded continuously and digitally stored. Description: No clear posterior dominant was seen.  EEG showed continuous generalized amplitude 3 to 5 Hz theta-delta slowing. Hyperventilation and photic stimulation were not performed. Abnormality - Continuous slow, generalized IMPRESSION: This study is suggestive of severe diffuse encephalopathy, nonspecific etiology. No seizures or epileptiform discharges were seen throughout the recording. 97   CT HEAD WO CONTRAST  Result Date: 06/29/2019 CLINICAL DATA:  Encephalopathy. Additional history provided: Combative, restless, cognitive impairment with delirium, fever. COVID positive. History of diabetes mellitus, hypertension. EXAM: CT HEAD WITHOUT CONTRAST TECHNIQUE: Contiguous axial images were obtained from the base of the skull through the vertex without intravenous contrast. COMPARISON:  No pertinent prior studies available for comparison. FINDINGS: Brain: There is no evidence of acute intracranial hemorrhage or intracranial mass. No midline shift.No demarcated cortical infarction. A small hypodense extra-axial subdural collection is questioned overlying the right cerebral convexity, measuring up to 6 mm (series 4, image 27). Moderate generalized parenchymal atrophy. Vascular: No hyperdense vessel. Atherosclerotic calcifications Skull: Normal. Negative for fracture or focal lesion. Sinuses/Orbits: Visualized orbits demonstrate no acute abnormality. Mild ethmoid and right maxillary sinus mucosal thickening. Small left maxillary sinus mucous retention cyst. No significant mastoid effusion. Other: Small metallic foreign body within the scalp or  possibly imbedded  within the superficial aspect of the left frontal calvarium. IMPRESSION: No acute intracranial hemorrhage or demarcated cortical infarct. Questionable small hypodense extra-axial subdural collection measuring up to 6 mm in thickness overlying the right cerebral convexity. This could reflect a hygroma or chronic subdural hematoma. No midline shift. Moderate generalized parenchymal atrophy. Mild paranasal sinus mucosal thickening. Left maxillary sinus mucous retention cyst. Electronically Signed   By: Jackey Loge DO   On: 06/29/2019 18:08   CT ANGIO CHEST PE W OR WO CONTRAST  Result Date: 07/02/2019 CLINICAL DATA:  Intermittent shortness of breath times 2-3 days. EXAM: CT ANGIOGRAPHY CHEST WITH CONTRAST TECHNIQUE: Multidetector CT imaging of the chest was performed using the standard protocol during bolus administration of intravenous contrast. Multiplanar CT image reconstructions and MIPs were obtained to evaluate the vascular anatomy. CONTRAST:  OMNIPAQUE IOHEXOL 350 MG/ML SOLN COMPARISON:  None. FINDINGS: Cardiovascular: Mild intraluminal filling defects are seen involving multiple middle lobe and lower lobe branches of the right pulmonary artery. Normal heart size. No pericardial effusion. Marked severity coronary artery calcification is seen. Mediastinum/Nodes: No enlarged mediastinal, hilar, or axillary lymph nodes. Thyroid gland, trachea, and esophagus demonstrate no significant findings. Lungs/Pleura: Moderate to marked severity patchy infiltrates are seen throughout both lungs. There is no evidence of a pleural effusion or pneumothorax. Upper Abdomen: A 2.8 cm x 1.8 cm cystic appearing area is seen within the left lobe of the liver. A solitary subcentimeter gallstone is seen within the lumen of an otherwise normal-appearing gallbladder. Musculoskeletal: Multilevel degenerative changes seen throughout the thoracic spine. Review of the MIP images confirms the above findings.  IMPRESSION: 1. Mild to moderate severity pulmonary embolism involving multiple middle lobe and lower lobe branches of the right pulmonary artery. 2. Moderate to marked severity patchy bilateral infiltrates. 3. Marked severity coronary artery calcification. 4. Cholelithiasis. 5. 2.8 cm x 1.8 cm cystic appearing area within the left lobe of the liver. Electronically Signed   By: Aram Candela M.D.   On: 07/02/2019 01:08   MR BRAIN WO CONTRAST  Result Date: 07/03/2019 CLINICAL DATA:  85 year old male with altered mental status for 2 days. Positive COVID-19. Encephalopathy. Questionable extra-axial fluid collection over the right convexity on recent noncontrast CT. EXAM: MRI HEAD WITHOUT CONTRAST TECHNIQUE: Multiplanar, multiecho pulse sequences of the brain and surrounding structures were obtained without intravenous contrast. COMPARISON:  Head CT 07/19/2019. FINDINGS: Brain: Mildly asymmetric extra-axial CSF is redemonstrated along the right convexity, but on coronal T2 images there is no evidence of displacement of the dural veins, no subdural collection (series 12, image 17). No restricted diffusion to suggest acute infarction. No midline shift, mass effect, evidence of mass lesion, ventriculomegaly, extra-axial collection or acute intracranial hemorrhage. Cervicomedullary junction and pituitary are within normal limits. Artifact suspected on T2* imaging (series 10, image 11). No convincing cortical encephalomalacia or chronic cerebral blood products. Mild T2 heterogeneity in the bilateral basal ganglia mostly reflects perivascular spaces. Minimal nonspecific T2 and FLAIR hyperintensity. Brainstem and cerebellum appear negative. Vascular: Major intracranial vascular flow voids are preserved. The left vertebral artery appears dominant. Skull and upper cervical spine: Negative visible cervical spine and bone marrow signal. Sinuses/Orbits: Postoperative changes to both globes but otherwise negative orbits. Mild  paranasal sinus mucosal thickening. Other: Mastoids are well pneumatized. Visible internal auditory structures appear normal. Scalp and face soft tissues appear negative. IMPRESSION: No acute intracranial abnormality and largely unremarkable for age noncontrast MRI appearance of the brain. Electronically Signed   By: Althea Grimmer.D.  On: 07/03/2019 15:45   US Venous Img Lower Bilateral (DVT)  Result Date: 07/02/2019 CLINICAL DATA:  Pulmonary embolism.  Evaluate for DVT. EXAM: BILATERAL LOWER EXTREMITY VENOUS DOPPLER ULTRASOUND TECHNIQUE: Gray-scale sonography with graded compression, as well as color Doppler and duplex ultrasound were performed to evaluate the lower extremity deep venous systems from the level of the common femoral vein and including the common femoral, femoral, profunda femoral, popliteal and calf veins including the posterior tibial, peroneal and gastrocnemius veins when visible. The superficial great saphenous vein was also interrogated. Spectral Doppler was utilized to evaluate flow at rest and with distal augmentation maneuvers in the common femoral, femoral and popliteal veins. COMPARISON:  None. FINDINGS: RIGHT LOWER EXTREMITY Common Femoral Vein: No evidence of thrombus. Normal compressibility, respiratory phasicity and response to augmentation. Saphenofemoral Junction: No evidence of thrombus. Normal compressibility and flow on color Doppler imaging. Profunda Femoral Vein: No evidence of thrombus. Normal compressibility and flow on color Doppler imaging. Femoral Vein: No evidence of thrombus. Normal compressibility, respiratory phasicity and response to augmentation. Popliteal Vein: No evidence of thrombus. Normal compressibility, respiratory phasicity and response to augmentation. Calf Veins: No evidence of thrombus. Normal compressibility and flow on color Doppler imaging. Superficial Great Saphenous Vein: No evidence of thrombus. Normal compressibility. Other Findings:  None. LEFT LOWER  EXTREMITY - evaluation of the left lower extremity is degraded/limited due to patient's inability to tolerate the examination. Common Femoral Vein: No evidence of thrombus. Normal compressibility, respiratory phasicity and response to augmentation. Saphenofemoral Junction: Not evaluated Profunda Femoral Vein: Not evaluated Femoral Vein: Not evaluated Popliteal Vein: Not evaluated Calf Veins: No evidence of thrombus. Normal compressibility and flow on color Doppler imaging. Superficial Great Saphenous Vein: No evidence of thrombus. Normal compressibility. Other Findings:  None. IMPRESSION: 1. No evidence of DVT within right lower extremity. 2. No evidence of DVT within the left lower extremity, though evaluation degraded/limited secondary to patient's inability to tolerate the examination. Electronically Signed   By: Simonne Come M.D.   On: 07/02/2019 12:26   DG Chest Port 1 View  Result Date: 06/23/2019 CLINICAL DATA:  Shortness of breath for 7 days EXAM: PORTABLE CHEST 1 VIEW COMPARISON:  None. FINDINGS: Mediastinal contour is normal. Heart size is enlarged. There is mild hazy opacity of both lung bases. There is no pulmonary edema. No acute abnormalities identified in the visualized bony structures. IMPRESSION: Mild hazy opacity of both lung bases, developing pneumonia is not excluded. Electronically Signed   By: Sherian Rein M.D.   On: 06/23/2019 12:37   [place] EKG SITE RITE  Result Date: 07/01/2019 If Site Rite image not attached, placement could not be confirmed due to current cardiac rhythm.   Shon Hale, MD  [facility]  If 7PM-7AM, please contact night-coverage [email or web address]  07/06/2019, 7:24 PM   LOS: 13 days

## 2019-07-06 NOTE — Progress Notes (Signed)
Pt daughter Marylu Lund called to inquire why pt has not been transferred to [facility]. Told daughter that were waiting on insurance authorization prior to transfer and would not be leaving today.  Marylu Lund verbalized unhappiness with staff and would be coming to speak with [facility] on Monday.All questions answered appropriately at this time.

## 2019-07-06 NOTE — TOC Progression Note (Signed)
Transition of Care [facility]) - Progression Note    Patient Details  Name: Blake Collins MRN: [id number] Date of Birth: Apr 23, 1934  Transition of Care [facility]) CM/SW Contact  Emilianna Barlowe, Chrystine Oiler, RN Phone Number: 07/06/2019, 2:15 PM  Clinical Narrative:  [facility] made a bed offer. Call placed to notify [employer] of bed offer and check status of insurance authorization.  Updated daughter, she is agreeable to plan.      Expected Discharge Plan: Skilled Nursing Facility Barriers to Discharge: Insurance Authorization  Expected Discharge Plan and Services Expected Discharge Plan: Skilled Nursing Facility         Expected Discharge Date: 07/06/19                                     Social Determinants of Health (SDOH) Interventions    Readmission Risk Interventions No flowsheet data found.

## 2019-07-07 LAB — FERRITIN: Ferritin: 118 ng/mL (ref 24–336)

## 2019-07-07 LAB — C-REACTIVE PROTEIN: CRP: 3.2 mg/dL — ABNORMAL HIGH (ref <1.0)

## 2019-07-07 LAB — GLUCOSE, CAPILLARY
Glucose-Capillary: 110 mg/dL — ABNORMAL HIGH (ref 70–99)
Glucose-Capillary: 110 mg/dL — ABNORMAL HIGH (ref 70–99)
Glucose-Capillary: 87 mg/dL (ref 70–99)

## 2019-07-07 LAB — D-DIMER, QUANTITATIVE: D-Dimer, Quant: 2.29 ug/mL-FEU — ABNORMAL HIGH (ref 0.00–0.50)

## 2019-07-07 MED ORDER — ACETAMINOPHEN 325 MG PO TABS: 650.0000 mg | ORAL_TABLET | Freq: Four times a day (QID) | ORAL | 0 refills | Status: AC | PRN

## 2019-07-07 MED ORDER — ZINC SULFATE 220 (50 ZN) MG PO CAPS: 220.0000 mg | ORAL_CAPSULE | Freq: Every day | ORAL | 2 refills | Status: AC

## 2019-07-07 MED ORDER — ASPIRIN EC 81 MG PO TBEC: 81.0000 mg | DELAYED_RELEASE_TABLET | Freq: Every day | ORAL | 3 refills | Status: AC

## 2019-07-07 MED ORDER — HYDROCODONE-ACETAMINOPHEN 5-325 MG PO TABS: 1.0000 | ORAL_TABLET | Freq: Two times a day (BID) | ORAL | 0 refills | Status: AC | PRN

## 2019-07-07 MED ORDER — ATORVASTATIN CALCIUM 20 MG PO TABS: 20.0000 mg | ORAL_TABLET | Freq: Every evening | ORAL | 1 refills | Status: AC

## 2019-07-07 MED ORDER — ELIQUIS DVT/PE STARTER PACK 5 MG PO TBPK: ORAL_TABLET | ORAL | 0 refills | Status: AC

## 2019-07-07 MED ORDER — MELOXICAM 7.5 MG PO TABS: 7.5000 mg | ORAL_TABLET | Freq: Two times a day (BID) | ORAL | 2 refills | Status: DC

## 2019-07-07 NOTE — Plan of Care (Signed)

## 2019-07-07 NOTE — Discharge Instructions (Signed)
1) You are strongly advised to  to isolate/quarantine for at least 21 days from the date (21 days from 06/23/19) of your diagnoses with COVID-19 infection--please always wear a mask if you have to go outside the house 2) weekly CBC while on Eliquis for the first 3 weeks--- next  CBC will be Monday, 07/09/2019 3) you are taking Eliquis/apixaban for blood thinner so Avoid ibuprofen/Advil/Aleve/Motrin/Goody Powders/Naproxen/BC powders/Meloxicam/Diclofenac/Indomethacin and other Nonsteroidal anti-inflammatory medications as these will make you more likely to bleed and can cause stomach ulcers, can also cause Kidney problems.

## 2019-07-07 NOTE — TOC Progression Note (Signed)
Transition of Care [facility]) - Progression Note    Patient Details  Name: Blake Collins MRN: [id number] Date of Birth: November 17, 1934  Transition [facility] Care [facility]) CM/SW Contact  Shamyah Stantz Sherryle Lis, LCSW Phone Number: 07/07/2019, 3:02 PM  Clinical Narrative:   CSW in contact with [employer] PH: [phone] to follow up on insurance authorization. Patient has been approved for 5 days starting 07/06/2019. Patient Reference #: [id number]. Next review date: 07/10/2019. Patient will be discharged to [facility] skilled nursing and rehab.   Shavanna Furnari Sherryle Lis LCSWA Transitions of Care  Clinical Social Worker  Ph: [phone]    Expected Discharge Plan: Skilled Nursing Facility Barriers to Discharge: Insurance Authorization  Expected Discharge Plan and Services Expected Discharge Plan: Skilled Nursing Facility         Expected Discharge Date: 07/06/19                                     Social Determinants of Health (SDOH) Interventions    Readmission Risk Interventions No flowsheet data found.

## 2019-07-07 NOTE — TOC Transition Note (Signed)
Transition of Care [facility]) - CM/SW Discharge Note   Patient Details  Name: Blake Collins MRN: [id number] Date of Birth: 1934-12-12  Transition of Care [facility]) CM/SW Contact:  Merritt Mccravy Sherryle Lis, LCSW Phone Number: 07/07/2019, 3:52 PM   Clinical Narrative:   Patient discharging to [facility] via [facility]. CSW left VM for patient daughter, Blake Collins regarding this matter. Number to call for report is PH: [phone].   No further needs at this time.   Radames Mejorado Sherryle Lis LCSWA Transitions of Care  Clinical Social Worker  Ph: [phone]    Final next level of care: Skilled Nursing Facility Barriers to Discharge: No Barriers Identified   Patient Goals and CMS Choice Patient states their goals for this hospitalization and ongoing recovery are:: to discharge to [facility] for short term rehab and then to return home CMS Medicare.gov Compare Post Acute Care list provided to:: Patient Choice offered to / list presented to : Patient  Discharge Placement              Patient chooses bed at: [facility] Patient to be transferred to facility by: [facility] Name of family member notified: Blake Collins [phone] Patient and family notified of of transfer: 07/07/19  Discharge Plan and Services                                     Social Determinants of Health (SDOH) Interventions     Readmission Risk Interventions No flowsheet data found.

## 2019-07-07 NOTE — Discharge Summary (Addendum)
Blake Collins, is a 85 y.o. male  DOB 10/12/1934  MRN [id number].  Admission date:  06/23/2019  Admitting Physician  Blake Hartshornavid Tat, MD  Discharge Date:  07/07/2019   Primary MD  Blake CatchingNyland, [name], MD  Recommendations for primary care physician for things to follow:   1) You are strongly advised to  to isolate/quarantine for at least 21 days from the date (21 days from 06/23/19) of your diagnoses with COVID-19 infection--please always wear a mask if you have to go outside the house  Admission Diagnosis  Lower GI bleeding [K92.2] Acute respiratory failure with hypoxia (HCC) [J96.01] Acute renal failure, unspecified acute renal failure type (HCC) [N17.9] Anemia, unspecified type [D64.9]   Discharge Diagnosis  Lower GI bleeding [K92.2] Acute respiratory failure with hypoxia (HCC) [J96.01] Acute renal failure, unspecified acute renal failure type (HCC) [N17.9] Anemia, unspecified type [D64.9]    Principal Problem:   Acute respiratory failure due to COVID-19 [name] Active Problems:   Pulmonary embolism (HCC)   Acute renal failure superimposed on stage 3b chronic kidney disease (HCC)   Acute respiratory failure with hypoxia (HCC)   Generalized weakness   Lower GI bleed   Symptomatic anemia      Past Medical History:  Diagnosis Date  . Arthritis   . Diabetes mellitus   . Hypertension     Past Surgical History:  Procedure Laterality Date  . ADENOIDECTOMY    . APPENDECTOMY    . CATARACT EXTRACTION    . COLONOSCOPY    . endo    . TONSILLECTOMY       HPI  from the history and physical done on the day of admission:    HPI:  Blake Collins is a 85 y.o. male with medical history of diabetes mellitus type 2, hypertension, hyperlipidemia, CKD stage III, chronic low back pain, GERD, anxiety presenting with 1 to 2-week history of generalized weakness.  The patient has been complaining of some intermittent  rectal bleeding for the better part of a week.  He denies any hemoptysis, hematemesis, melena, constipation, diarrhea, abdominal pain.  He has not had any fevers, chills, chest pain, cough, hemoptysis. The patient does complain of some intermittent shortness of breath over the past 2 to 3 days and worsening generalized weakness.  He denies any recent travels, worsening lower extremity edema or increasing abdominal girth or orthopnea.  As result, he presented for further evaluation.  He denies any falling or syncope.  He denies any new medications other than the fact that he received his first COVID-19 vaccination 3 days prior to this admission.  He denies any dysuria, hematuria, headache, neck pain, flank pain.  He quit smoking 20 years ago after approximately 60 pack-year history.  Notably, the patient states that his son with whom he lives has had some URI type symptoms.  Apparently his son went to get a COVID-19 test, but the results are unknown at this time. In the emergency department, the patient was afebrile hemodynamically stable with oxygen saturation 86% on  room air.  He was placed on 2 L with oxygen saturation 95%.  BMP showed a serum creatinine 1.85 which is above his usual baseline.  LFTs were unremarkable.  WBC 8.3, hemoglobin 10.0, platelets 277,000.  Chest x-ray suggested possible hazy basilar opacities, cardiomegaly.   Hospital Course:   - Brief History: 85 y.o.malewith medical history ofdiabetes mellitus type 2, hypertension, hyperlipidemia, CKD stage III, chronic low back pain, GERD, anxiety presenting with 1 to 2-week history of generalized weakness. The patient has been complaining of some intermittent rectal bleeding for the better part of a week. He denies any hemoptysis, hematemesis, melena, constipation, diarrhea, abdominal pain. He has not had any fevers, chills, chest pain, cough, hemoptysis. The patient does complain of some intermittent shortness of breath over the past  2 to 3 days and worsening generalized weakness. He denies any recent travels, worsening lower extremity edema or increasing abdominal girth or orthopnea. As result, he presented for further evaluation. He denies any falling or syncope. He denies any new medications other than the fact that he received his first COVID-19 vaccination 3 days prior to this admission. He denies any dysuria, hematuria, headache, neck pain, flank pain. He quit smoking 20 years ago after approximately 60pack-year history. Notably, the patient states that his son with whom he lives has had some URI type symptoms. Apparently his son went to get a COVID-19 test, but the results are unknown at this time. In the emergency department, the patient was afebrile hemodynamically stable with oxygen saturation 86% on room air. He was placed on 2 L with oxygen saturation 95%. BMP showed a serum creatinine 1.85 which is above his usual baseline. LFTs were unremarkable. WBC 8.3, hemoglobin 10.0, platelets 277,000. Chest x-ray suggested possible hazy basilar opacities, cardiomegaly.Although initial POC SARS-CoV2 antigen was neg, his RT-PCR came back positive.  Assessment/Plan: Acute respiratory failure with hypoxiadue COVID-19 pneumonia -Likely multifactorial including underlying COPD,OSA and COVID-19 -Overall hypoxia has resolved - procalcitonin0.20 -LSL373 -CRP20.8>>20.2>>>3.9 -Ferritin97>>115>>>80 -D-dimer--3.31>>3.17>>2.54 -EKG shows sinus rhythm, IVCD, nonspecific ST ST wave changes -SARS-CoV2 RT-PCR--positive -He completed his course of remdesivir on 06/27/19 -Overall much improved, no respiratory distress and Hypoxia has resolved  Symptomatic Anemia/Rectal bleeding -Review of the medical record shows baseline hemoglobin11-12 -GI consultappreciated--no intervention plans -Tolerating solid food -ContinuePPI -Hgb stable--at 10.5  Acute on chronic renal failure--CKD stage IIIb -Baseline creatinine  1.2-1.5 -serum creatinine peaked 1.85 -Secondary to volume depletion --creatinine is now normal with hydration at 1.24 -Holding lisinopril  Pyuria -concerned about UTI -urine culture <10K organisms -d/c ceftriaxone-->had 3 days  Cognitive impairment with delirium/acute metabolic encephalopathy appears to be resolving -possibly has underlying dementia--daughter agrees  -Patient was very functional prior to admission (driving, paying bills) -No further agitation, -Patient is very conversational, coherent and appropriate -Patient has been off restraints for more than 96 hours, -Overall much improved, back to baseline --no longer requiring as needed sedatives -CT head did not show any acute process -Ammonia checked and was normal -ABG does not show elevated PCO2 -EEG did not show any epileptiform discharges -MRI brain does not show any evidence of infarct -Mental status overall significantly improved -He has not required any Ativan -Seroquel discontinued  Generalized weakness -Likely multifactorial includingCOVID 19,symptomatic anemia, acute on chronic renal failure -SerumB12--1289 -folate--15.1 -TSH--1.296 -PT evaluation-->SNF  Diabetes mellitus type 2 -Okay to restart Metformin, stop glipizide -06/23/19 A1C--6.4 reflecting excellent DM control PTA  Essential hypertension Discontinued lisinopril/HCTZ in the setting of soft blood pressure and acute on chronic renal failure -BP remains well  controlled  COPD -Stable, okay to use bronchodilators  Hyperlipidemia -Continue statin  Acute pulmonary embolus -Noted on CT angiogram of chest -Likely related to COVID-19 Treated with therapeutic dose Lovenox Okay to discharge on Eliquis  -Venous Doppler lower extremity negative for DVT -Check weekly CBC  Disposition Plan: Patient From: Home D/C Place: Skilled nursing facility placement w  barriers: Mental status-much improved patient is medically ready for  discharge if bed available  Family Communication:Daughter updated on phone previously  Consultants:GI  Code Status: FULL   Procedures: As Listed in Progress Note Above  Antibiotics: Ceftriaxone 3/28>>>3/30   Discharge Condition: Stable  Follow UP  Contact information for after-discharge care    Destination    [facility] .   Service: Skilled Nursing Contact information: [address] [place] [address] [phone]               Diet and Activity recommendation:  As advised  Discharge Instructions    Discharge Instructions    Ambulatory referral to Neuropsychology   Complete by: As directed    Call MD for:  difficulty breathing, headache or visual disturbances   Complete by: As directed    Call MD for:  persistant dizziness or light-headedness   Complete by: As directed    Call MD for:  persistant nausea and vomiting   Complete by: As directed    Call MD for:  severe uncontrolled pain   Complete by: As directed    Call MD for:  temperature >100.4   Complete by: As directed    Diet - low sodium heart healthy   Complete by: As directed    Discharge instructions   Complete by: As directed    1) You are strongly advised to  to isolate/quarantine for at least 21 days from the date (21 days from 06/23/19) of your diagnoses with COVID-19 infection--please always wear a mask if you have to go outside the house 2) weekly CBC while on Eliquis for the first 3 weeks--- next  CBC will be Monday, 07/09/2019 3) you are taking Eliquis/apixaban for blood thinner so Avoid ibuprofen/Advil/Aleve/Motrin/Goody Powders/Naproxen/BC powders/Meloxicam/Diclofenac/Indomethacin and other Nonsteroidal anti-inflammatory medications as these will make you more likely to bleed and can cause stomach ulcers, can also cause Kidney problems.   Increase activity slowly   Complete by: As directed        Discharge Medications      Allergies as of 07/07/2019   No Known Allergies     Medication List    STOP taking these medications   acetaminophen 650 MG CR tablet Commonly known as: TYLENOL Replaced by: acetaminophen 325 MG tablet   lisinopril-hydrochlorothiazide 20-12.5 MG tablet Commonly known as: ZESTORETIC   meloxicam 7.5 MG tablet Commonly known as: MOBIC     TAKE these medications   acetaminophen 325 MG tablet Commonly known as: TYLENOL Take 2 tablets (650 mg total) by mouth every 6 (six) hours as needed for mild pain or fever (or Fever >/= 101). Replaces: acetaminophen 650 MG CR tablet   aspirin EC 81 MG tablet Take 1 tablet (81 mg total) by mouth daily with breakfast. What changed:   when to take this  reasons to take this   atorvastatin 20 MG tablet Commonly known as: LIPITOR Take 1 tablet (20 mg total) by mouth every evening. What changed:   medication strength  how much to take  when to take this   Eliquis DVT/PE Starter Pack Generic drug: Apixaban Starter Pack (  10mg  and 5mg ) Take as directed on package: start with two-5mg  tablets twice daily for 7 days. On day 8, switch to one-5mg  tablet twice daily.   fexofenadine 180 MG tablet Commonly known as: ALLEGRA Take 1 tablet by mouth daily.   Fish Oil 1000 MG Caps Take 2 capsules by mouth in the morning and at bedtime.   gabapentin 300 MG capsule Commonly known as: NEURONTIN Take 300 mg by mouth daily.   HYDROcodone-acetaminophen 5-325 MG tablet Commonly known as: NORCO/VICODIN Take 1 tablet by mouth 2 (two) times daily as needed for moderate pain or severe pain.   metFORMIN 1000 MG tablet Commonly known as: GLUCOPHAGE Take 1,000 mg by mouth 2 (two) times daily with a meal.   methocarbamol 500 MG tablet Commonly known as: ROBAXIN Take 500 mg by mouth at bedtime.   omeprazole 40 MG capsule Commonly known as: PRILOSEC Take 40 mg by mouth 2 (two) times daily.   zinc sulfate 220 (50 Zn) MG capsule Take 1 capsule (220  mg total) by mouth daily. Start taking on: July 08, 2019       Major procedures and Radiology Reports - PLEASE review detailed and final reports for all details, in brief -  EEG  Result Date: 07/02/2019 Lora Havens, MD     07/02/2019 12:18 PM Patient Name: RYETT HAMMAN MRN: [id number] Epilepsy Attending: Lora Havens Referring Physician/Provider: Dr. Kathie Dike Date: 07/02/2019 Duration: 23.36 minutes. Patient history: 85 year old male with history of dementia now admitted with COVID-19 infection.  EEG to evaluate for altered mental status and seizures. Level of alertness: Lethargic AEDs during EEG study: Ativan Technical aspects: This EEG study was done with scalp electrodes positioned according to the 10-20 International system of electrode placement. Electrical activity was acquired at a sampling rate of 500Hz  and reviewed with a high frequency filter of 70Hz  and a low frequency filter of 1Hz . EEG data were recorded continuously and digitally stored. Description: No clear posterior dominant was seen.  EEG showed continuous generalized amplitude 3 to 5 Hz theta-delta slowing. Hyperventilation and photic stimulation were not performed. Abnormality - Continuous slow, generalized IMPRESSION: This study is suggestive of severe diffuse encephalopathy, nonspecific etiology. No seizures or epileptiform discharges were seen throughout the recording. Lora Havens   CT HEAD WO CONTRAST  Result Date: 06/29/2019 CLINICAL DATA:  Encephalopathy. Additional history provided: Combative, restless, cognitive impairment with delirium, fever. COVID positive. History of diabetes mellitus, hypertension. EXAM: CT HEAD WITHOUT CONTRAST TECHNIQUE: Contiguous axial images were obtained from the base of the skull through the vertex without intravenous contrast. COMPARISON:  No pertinent prior studies available for comparison. FINDINGS: Brain: There is no evidence of acute intracranial hemorrhage or intracranial  mass. No midline shift.No demarcated cortical infarction. A small hypodense extra-axial subdural collection is questioned overlying the right cerebral convexity, measuring up to 6 mm (series 4, image 27). Moderate generalized parenchymal atrophy. Vascular: No hyperdense vessel. Atherosclerotic calcifications Skull: Normal. Negative for fracture or focal lesion. Sinuses/Orbits: Visualized orbits demonstrate no acute abnormality. Mild ethmoid and right maxillary sinus mucosal thickening. Small left maxillary sinus mucous retention cyst. No significant mastoid effusion. Other: Small metallic foreign body within the scalp or possibly imbedded within the superficial aspect of the left frontal calvarium. IMPRESSION: No acute intracranial hemorrhage or demarcated cortical infarct. Questionable small hypodense extra-axial subdural collection measuring up to 6 mm in thickness overlying the right cerebral convexity. This could reflect a hygroma or chronic subdural hematoma. No midline shift. Moderate generalized  parenchymal atrophy. Mild paranasal sinus mucosal thickening. Left maxillary sinus mucous retention cyst. Electronically Signed   By: Jackey Loge DO   On: 06/29/2019 18:08   CT ANGIO CHEST PE W OR WO CONTRAST  Result Date: 07/02/2019 CLINICAL DATA:  Intermittent shortness of breath times 2-3 days. EXAM: CT ANGIOGRAPHY CHEST WITH CONTRAST TECHNIQUE: Multidetector CT imaging of the chest was performed using the standard protocol during bolus administration of intravenous contrast. Multiplanar CT image reconstructions and MIPs were obtained to evaluate the vascular anatomy. CONTRAST:  OMNIPAQUE IOHEXOL 350 MG/ML SOLN COMPARISON:  None. FINDINGS: Cardiovascular: Mild intraluminal filling defects are seen involving multiple middle lobe and lower lobe branches of the right pulmonary artery. Normal heart size. No pericardial effusion. Marked severity coronary artery calcification is seen. Mediastinum/Nodes: No  enlarged mediastinal, hilar, or axillary lymph nodes. Thyroid gland, trachea, and esophagus demonstrate no significant findings. Lungs/Pleura: Moderate to marked severity patchy infiltrates are seen throughout both lungs. There is no evidence of a pleural effusion or pneumothorax. Upper Abdomen: A 2.8 cm x 1.8 cm cystic appearing area is seen within the left lobe of the liver. A solitary subcentimeter gallstone is seen within the lumen of an otherwise normal-appearing gallbladder. Musculoskeletal: Multilevel degenerative changes seen throughout the thoracic spine. Review of the MIP images confirms the above findings. IMPRESSION: 1. Mild to moderate severity pulmonary embolism involving multiple middle lobe and lower lobe branches of the right pulmonary artery. 2. Moderate to marked severity patchy bilateral infiltrates. 3. Marked severity coronary artery calcification. 4. Cholelithiasis. 5. 2.8 cm x 1.8 cm cystic appearing area within the left lobe of the liver. Electronically Signed   By: Aram Candela M.D.   On: 07/02/2019 01:08   MR BRAIN WO CONTRAST  Result Date: 07/03/2019 CLINICAL DATA:  85 year old male with altered mental status for 2 days. Positive COVID-19. Encephalopathy. Questionable extra-axial fluid collection over the right convexity on recent noncontrast CT. EXAM: MRI HEAD WITHOUT CONTRAST TECHNIQUE: Multiplanar, multiecho pulse sequences of the brain and surrounding structures were obtained without intravenous contrast. COMPARISON:  Head CT 07/19/2019. FINDINGS: Brain: Mildly asymmetric extra-axial CSF is redemonstrated along the right convexity, but on coronal T2 images there is no evidence of displacement of the dural veins, no subdural collection (series 12, image 17). No restricted diffusion to suggest acute infarction. No midline shift, mass effect, evidence of mass lesion, ventriculomegaly, extra-axial collection or acute intracranial hemorrhage. Cervicomedullary junction and pituitary  are within normal limits. Artifact suspected on T2* imaging (series 10, image 11). No convincing cortical encephalomalacia or chronic cerebral blood products. Mild T2 heterogeneity in the bilateral basal ganglia mostly reflects perivascular spaces. Minimal nonspecific T2 and FLAIR hyperintensity. Brainstem and cerebellum appear negative. Vascular: Major intracranial vascular flow voids are preserved. The left vertebral artery appears dominant. Skull and upper cervical spine: Negative visible cervical spine and bone marrow signal. Sinuses/Orbits: Postoperative changes to both globes but otherwise negative orbits. Mild paranasal sinus mucosal thickening. Other: Mastoids are well pneumatized. Visible internal auditory structures appear normal. Scalp and face soft tissues appear negative. IMPRESSION: No acute intracranial abnormality and largely unremarkable for age noncontrast MRI appearance of the brain. Electronically Signed   By: Odessa Fleming M.D.   On: 07/03/2019 15:45   US Venous Img Lower Bilateral (DVT)  Result Date: 07/02/2019 CLINICAL DATA:  Pulmonary embolism.  Evaluate for DVT. EXAM: BILATERAL LOWER EXTREMITY VENOUS DOPPLER ULTRASOUND TECHNIQUE: Gray-scale sonography with graded compression, as well as color Doppler and duplex ultrasound were performed to evaluate  the lower extremity deep venous systems from the level of the common femoral vein and including the common femoral, femoral, profunda femoral, popliteal and calf veins including the posterior tibial, peroneal and gastrocnemius veins when visible. The superficial great saphenous vein was also interrogated. Spectral Doppler was utilized to evaluate flow at rest and with distal augmentation maneuvers in the common femoral, femoral and popliteal veins. COMPARISON:  None. FINDINGS: RIGHT LOWER EXTREMITY Common Femoral Vein: No evidence of thrombus. Normal compressibility, respiratory phasicity and response to augmentation. Saphenofemoral Junction: No  evidence of thrombus. Normal compressibility and flow on color Doppler imaging. Profunda Femoral Vein: No evidence of thrombus. Normal compressibility and flow on color Doppler imaging. Femoral Vein: No evidence of thrombus. Normal compressibility, respiratory phasicity and response to augmentation. Popliteal Vein: No evidence of thrombus. Normal compressibility, respiratory phasicity and response to augmentation. Calf Veins: No evidence of thrombus. Normal compressibility and flow on color Doppler imaging. Superficial Great Saphenous Vein: No evidence of thrombus. Normal compressibility. Other Findings:  None. LEFT LOWER EXTREMITY - evaluation of the left lower extremity is degraded/limited due to patient's inability to tolerate the examination. Common Femoral Vein: No evidence of thrombus. Normal compressibility, respiratory phasicity and response to augmentation. Saphenofemoral Junction: Not evaluated Profunda Femoral Vein: Not evaluated Femoral Vein: Not evaluated Popliteal Vein: Not evaluated Calf Veins: No evidence of thrombus. Normal compressibility and flow on color Doppler imaging. Superficial Great Saphenous Vein: No evidence of thrombus. Normal compressibility. Other Findings:  None. IMPRESSION: 1. No evidence of DVT within right lower extremity. 2. No evidence of DVT within the left lower extremity, though evaluation degraded/limited secondary to patient's inability to tolerate the examination. Electronically Signed   By: Simonne Come M.D.   On: 07/02/2019 12:26   DG Chest Port 1 View  Result Date: 06/23/2019 CLINICAL DATA:  Shortness of breath for 7 days EXAM: PORTABLE CHEST 1 VIEW COMPARISON:  None. FINDINGS: Mediastinal contour is normal. Heart size is enlarged. There is mild hazy opacity of both lung bases. There is no pulmonary edema. No acute abnormalities identified in the visualized bony structures. IMPRESSION: Mild hazy opacity of both lung bases, developing pneumonia is not excluded.  Electronically Signed   By: Sherian Rein M.D.   On: 06/23/2019 12:37   [place] EKG SITE RITE  Result Date: 07/01/2019 If Site Rite image not attached, placement could not be confirmed due to current cardiac rhythm.   Micro Results    Recent Results (from the past 240 hour(s))  Culture, Urine     Status: None   Collection Time: 06/28/19  8:45 PM   Specimen: Urine, Catheterized  Result Value Ref Range Status   Specimen Description   Final    URINE, CATHETERIZED Performed at [facility], [address], [place], [place] [address]    Special Requests   Final    NONE Performed at [facility], [address] [place], [place] [address]    Culture   Final    NO GROWTH Performed at [facility], [address] [place], [place] [address]    Report Status 06/30/2019 FINAL  Final    Today   Subjective    Loi Rennaker today has no new complaints, -Eating and drinking well -No bleeding concerns          Patient has been seen and examined prior to discharge   Objective   Blood pressure 111/66, pulse 82, temperature 97.8 F (36.6 C), temperature source Oral, resp. rate 17, height 5'  10" (1.778 m), weight 104.3 kg, SpO2 94 %.  No intake or output data in the 24 hours ending 07/07/19 1556  Exam General exam: Alert, awake, very cooperative, not agitated, able to follow commands Respiratory system: Clear to auscultation. Respiratory effort normal. Cardiovascular system:RRR. No murmurs, rubs, gallops. Gastrointestinal system: Abdomen is nondistended, soft and nontender, Normal bowel sounds heard. Central nervous system: Strength is equal in lower extremities and upper extremities bilaterally. Extremities: No C/C/E, +pedal pulses Skin: No rashes, lesions or ulcers Psychiatry: Patient is more awake, he is not agitated, very cooperative   Data Review   CBC w Diff:  Lab Results  Component Value Date   WBC 7.6 07/06/2019   HGB 10.5 (L) 07/06/2019   HCT 34.6 (L) 07/06/2019    PLT 141 (L) 07/06/2019   LYMPHOPCT 8 06/27/2019   MONOPCT 8 06/27/2019   EOSPCT 0 06/27/2019   BASOPCT 0 06/27/2019    CMP:  Lab Results  Component Value Date   NA 142 07/04/2019   K 3.9 07/04/2019   CL 108 07/04/2019   CO2 25 07/04/2019   BUN 29 (H) 07/04/2019   CREATININE 1.24 07/04/2019   PROT 6.0 (L) 06/29/2019   ALBUMIN 2.5 (L) 06/29/2019   BILITOT 0.8 06/29/2019   ALKPHOS 57 06/29/2019   AST 123 (H) 06/29/2019   ALT 66 (H) 06/29/2019  .   Total Discharge time is about 33 minutes  Shon Hale M.D on 07/07/2019 at 3:56 PM  Go to [email or web address] -  for contact info  [facility] - Office  [phone]

## 2020-03-18 ENCOUNTER — Encounter (INDEPENDENT_AMBULATORY_CARE_PROVIDER_SITE_OTHER): Payer: Self-pay | Admitting: *Deleted

## 2020-06-26 ENCOUNTER — Encounter (INDEPENDENT_AMBULATORY_CARE_PROVIDER_SITE_OTHER): Payer: Self-pay | Admitting: Gastroenterology

## 2020-06-26 ENCOUNTER — Ambulatory Visit (INDEPENDENT_AMBULATORY_CARE_PROVIDER_SITE_OTHER): Payer: Medicare HMO | Admitting: Gastroenterology

## 2020-06-26 ENCOUNTER — Telehealth (INDEPENDENT_AMBULATORY_CARE_PROVIDER_SITE_OTHER): Payer: Self-pay

## 2020-06-26 ENCOUNTER — Encounter (INDEPENDENT_AMBULATORY_CARE_PROVIDER_SITE_OTHER): Payer: Self-pay | Admitting: *Deleted

## 2020-06-26 NOTE — Telephone Encounter (Signed)
Patient no showed for his appointment with Dr.Daniel Marguerita Merles on 06/26/2020 at [facility].

## 2021-04-10 IMAGING — US US EXTREM LOW VENOUS
1 series · 13 of 24 positions shown · non-contrast
Comparison: None.

CLINICAL DATA: Pulmonary embolism.  Evaluate for DVT.



[Series 1: us extrem low venous · 0.09mm/px · 13 of 46 slices shown]
[im 1/46]
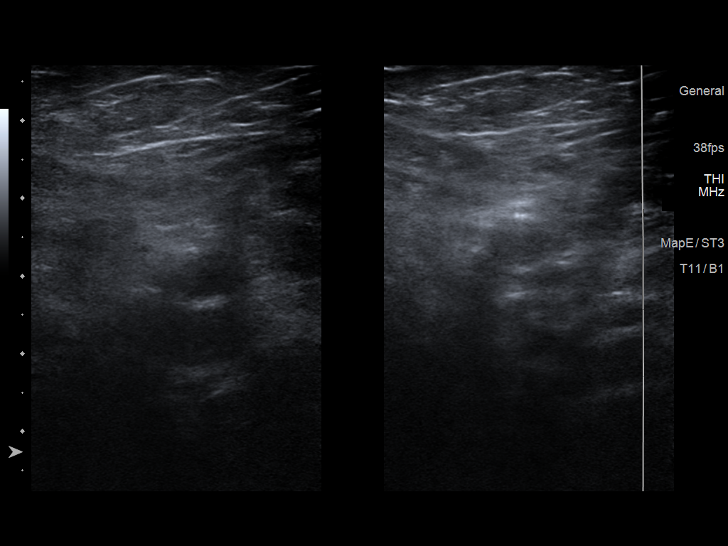
[im 4/46]
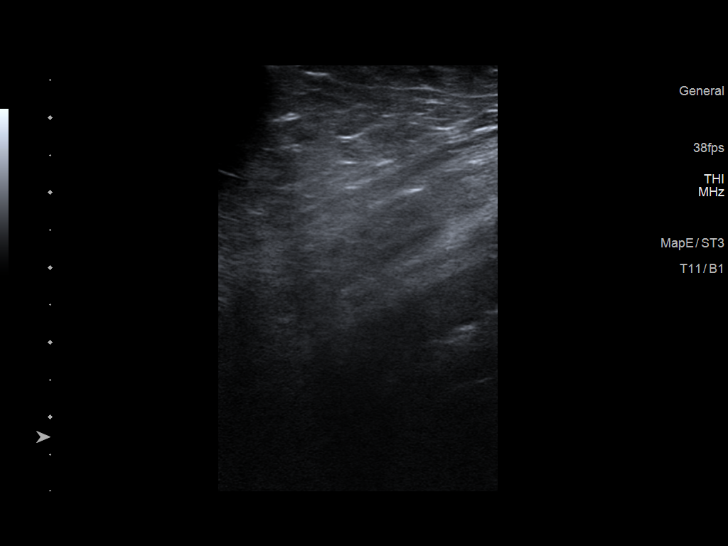
[im 8/46]
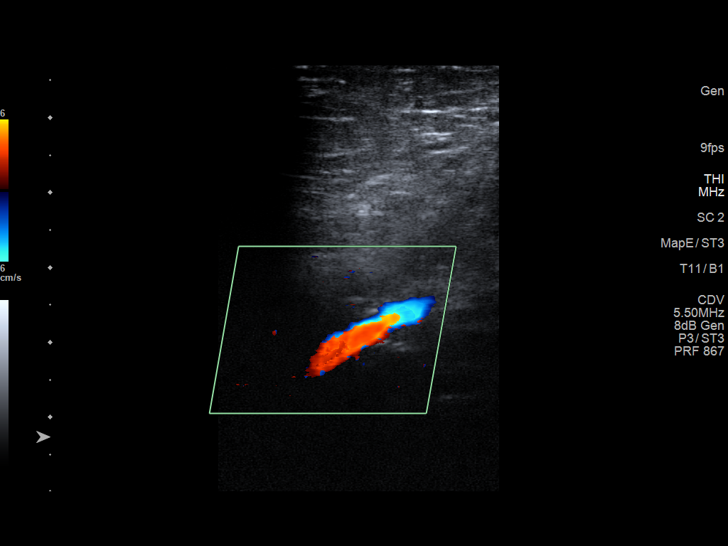
[im 12/46]
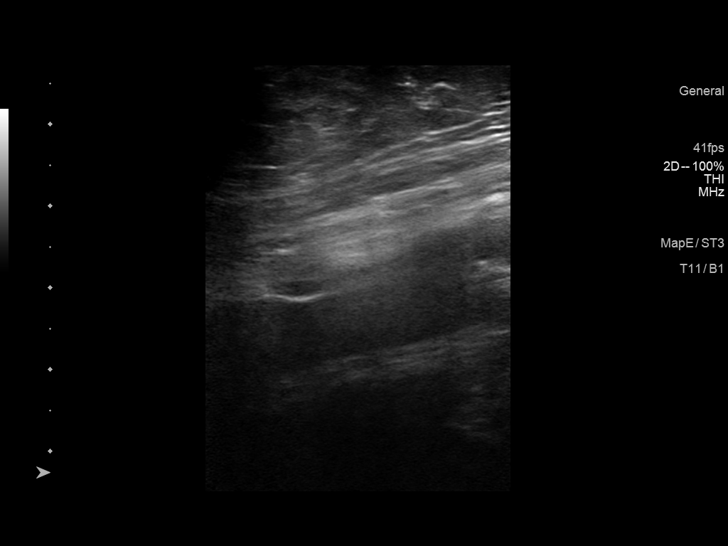
[im 16/46]
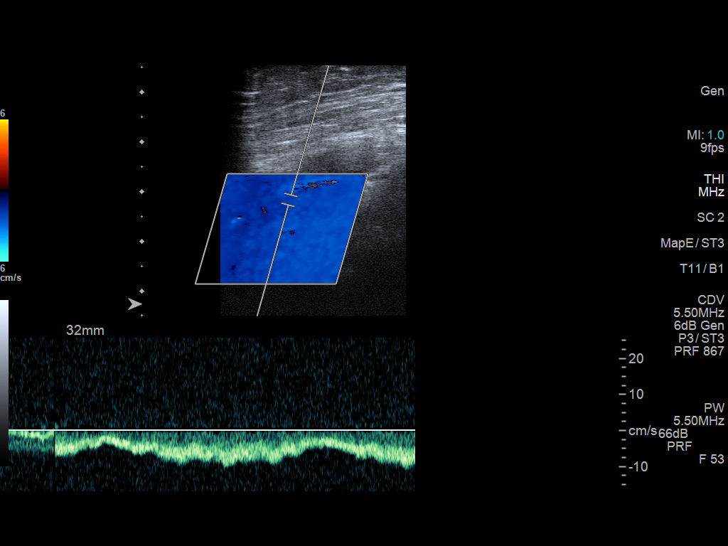
[im 20/46]
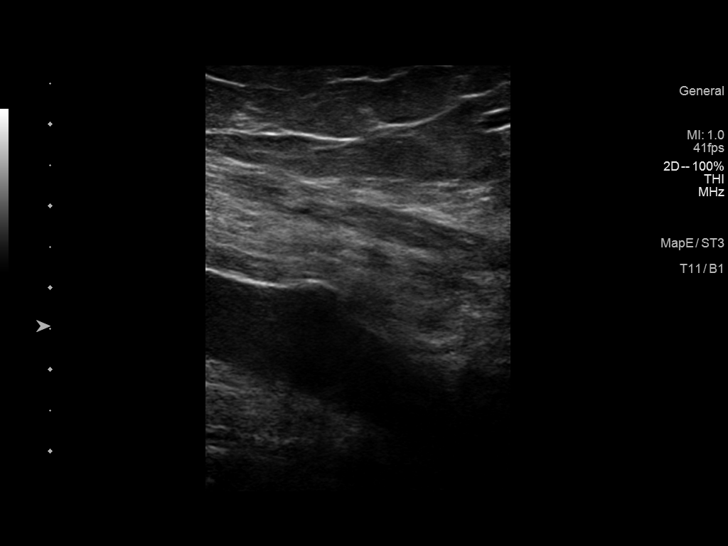
[im 24/46]
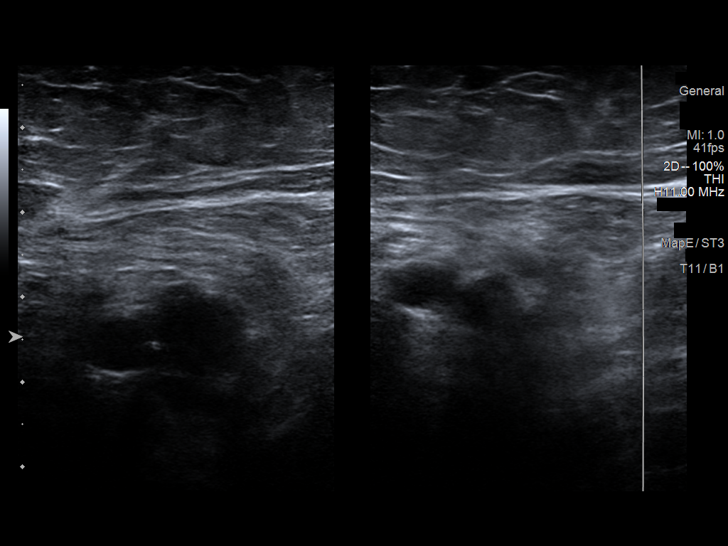
[im 26/46]
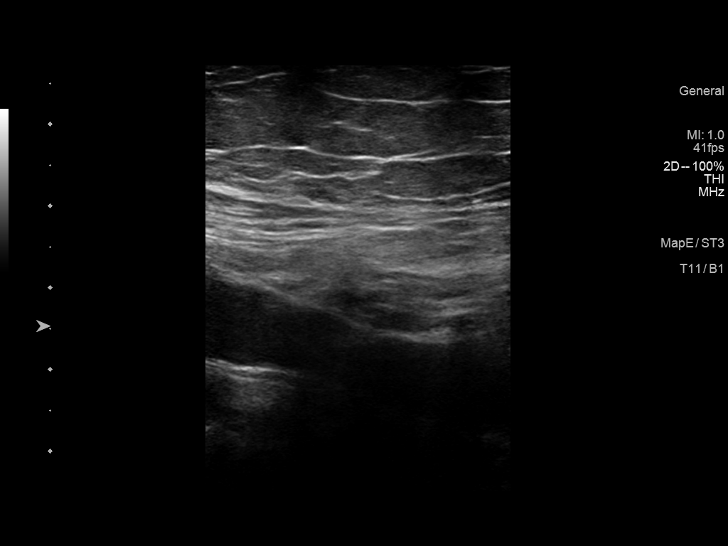
[im 30/46]
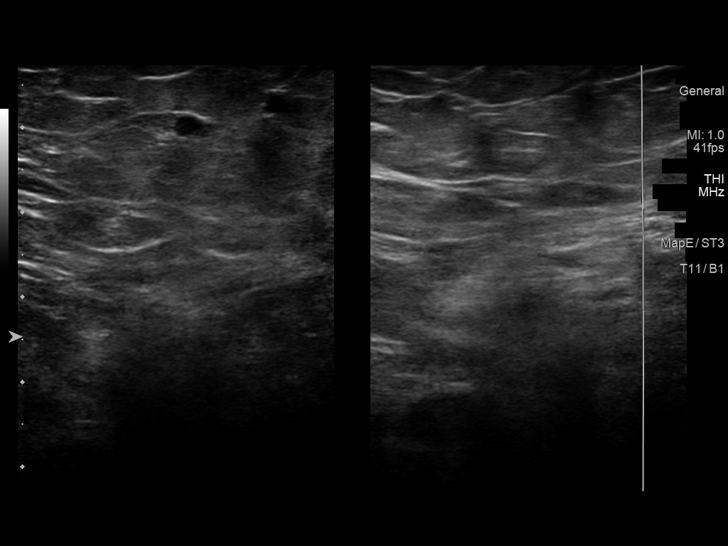
[im 34/46]
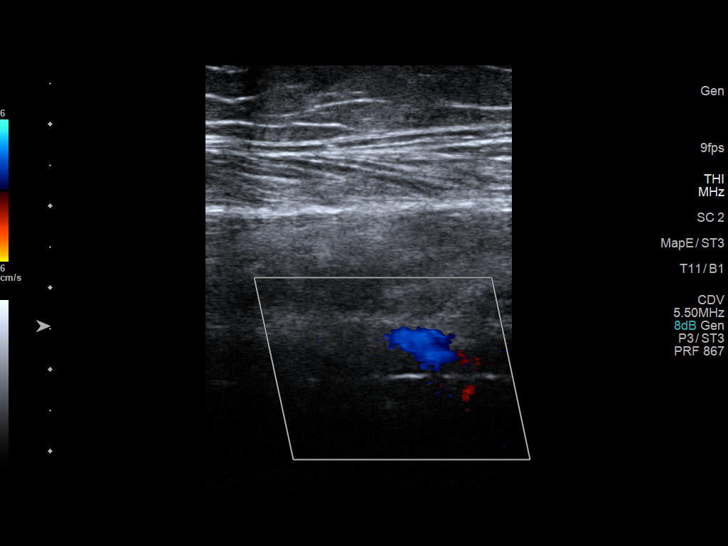
[im 38/46]
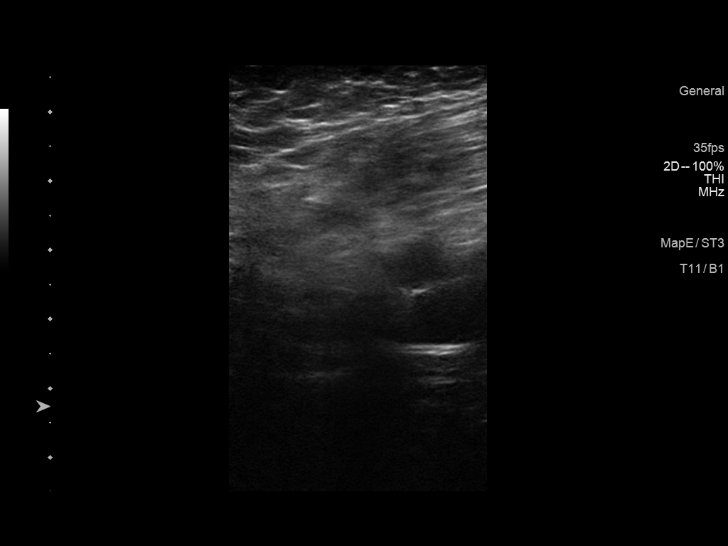
[im 42/46]
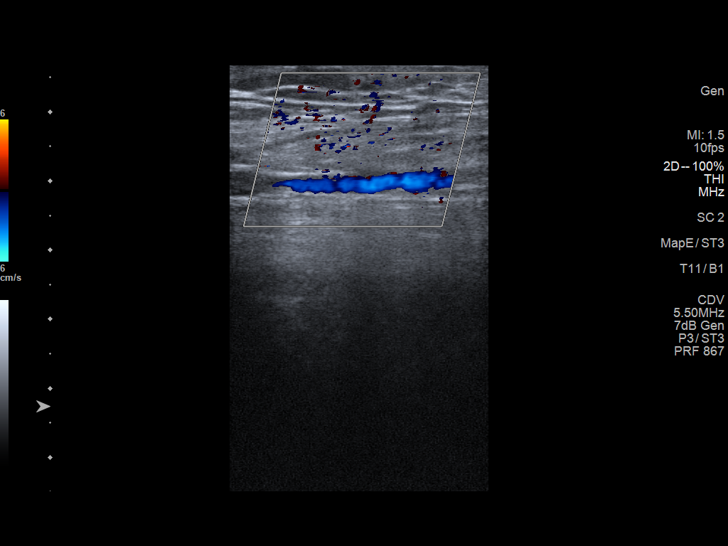
[im 46/46]
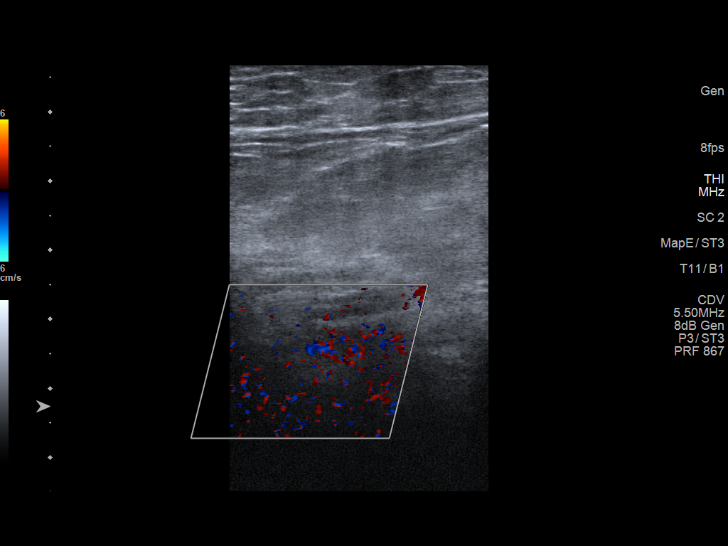

[13 of 24 positions shown; findings below may reference images not displayed]

FINDINGS: RIGHT LOWER EXTREMITY

Common Femoral Vein: No evidence of thrombus. Normal
compressibility, respiratory phasicity and response to augmentation.

Saphenofemoral Junction: No evidence of thrombus. Normal
compressibility and flow on color Doppler imaging.

Profunda Femoral Vein: No evidence of thrombus. Normal
compressibility and flow on color Doppler imaging.

Femoral Vein: No evidence of thrombus. Normal compressibility,
respiratory phasicity and response to augmentation.

Popliteal Vein: No evidence of thrombus. Normal compressibility,
respiratory phasicity and response to augmentation.

Calf Veins: No evidence of thrombus. Normal compressibility and flow
on color Doppler imaging.

Superficial Great Saphenous Vein: No evidence of thrombus. Normal
compressibility.

Other Findings:  None.

LEFT LOWER EXTREMITY - evaluation of the left lower extremity is
degraded/limited due to patient's inability to tolerate the
examination.

Common Femoral Vein: No evidence of thrombus. Normal
compressibility, respiratory phasicity and response to augmentation.

Saphenofemoral Junction: Not evaluated

Profunda Femoral Vein: Not evaluated

Femoral Vein: Not evaluated

Popliteal Vein: Not evaluated

Calf Veins: No evidence of thrombus. Normal compressibility and flow
on color Doppler imaging.

Superficial Great Saphenous Vein: No evidence of thrombus. Normal
compressibility.

Other Findings:  None.
IMPRESSION: 1. No evidence of DVT within right lower extremity.
2. No evidence of DVT within the left lower extremity, though
evaluation degraded/limited secondary to patient's inability to
tolerate the examination.

## 2021-04-11 IMAGING — MR MR HEAD W/O CM
8 of 10 series · 33 of 48 positions shown · non-contrast
Comparison: Head CT 07/19/2019.

CLINICAL DATA: 84-year-old male with altered mental status for 2
days. Positive BFPEM-KE. Encephalopathy.

Questionable extra-axial fluid collection over the right convexity
on recent noncontrast CT.
EXAM:
MRI HEAD WITHOUT CONTRAST
TECHNIQUE: Multiplanar, multiecho pulse sequences of the brain and surrounding
structures were obtained without intravenous contrast.

[Series 3: T1 · sagittal · 5.0mm · 0.43mm/px · 3 of 20 slices shown (1 of 2)]
[im 1/20]
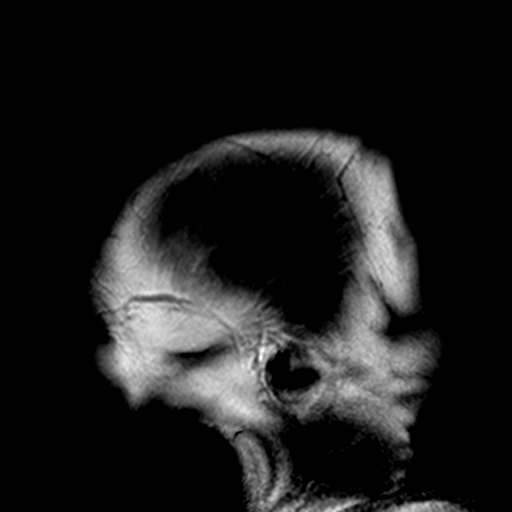
[im 10/20]
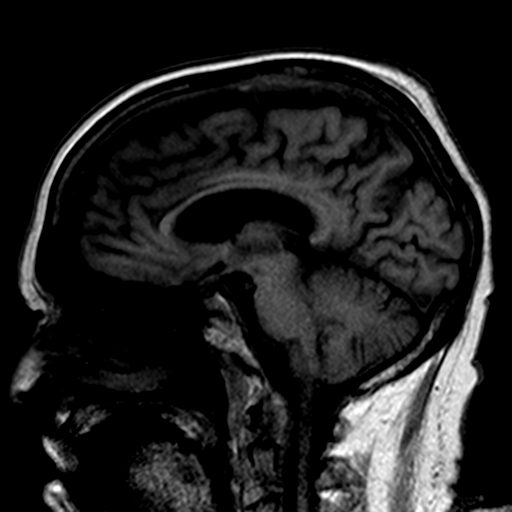
[im 20/20]
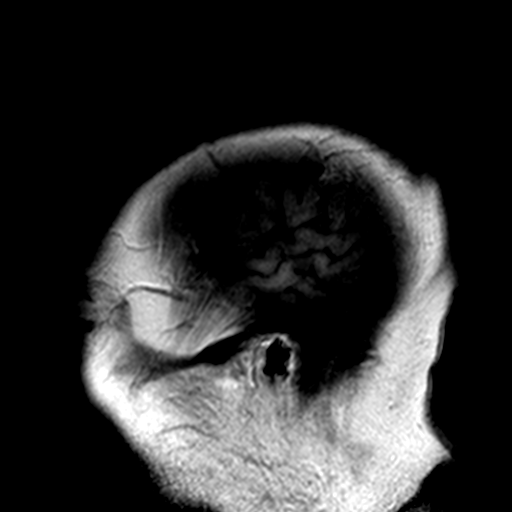

[Series 4: DWI · axial · 3.0mm · 0.77mm/px · z∈[-82,+78]mm · 6 of 55 slices shown (1 of 2)]
[im 1/55]
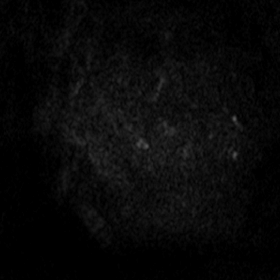
[im 11/55]
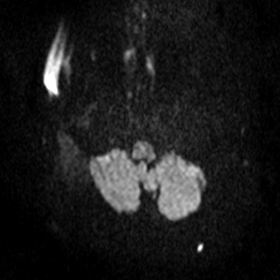
[im 22/55]
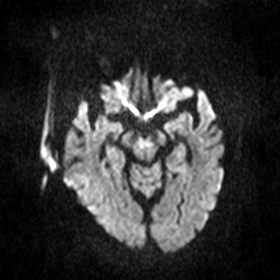
[im 33/55]
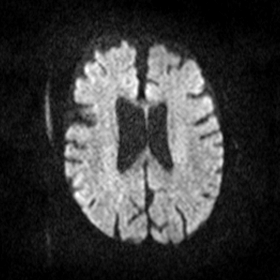
[im 44/55]
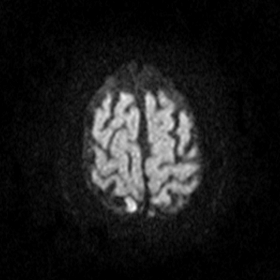
[im 55/55]
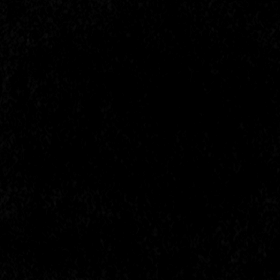

[Series 6: DWI · coronal · 5.0mm · 0.52mm/px · 4 of 35 slices shown (2 of 2)]
[im 1/35]
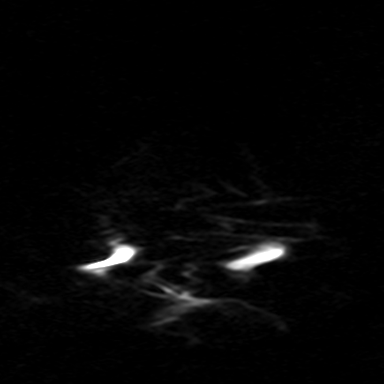
[im 12/35]
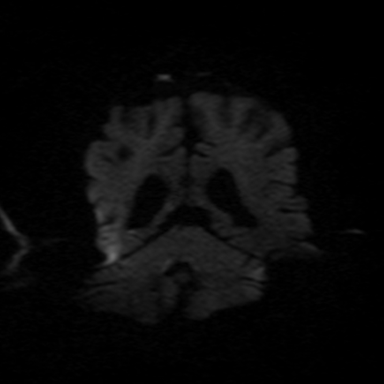
[im 23/35]
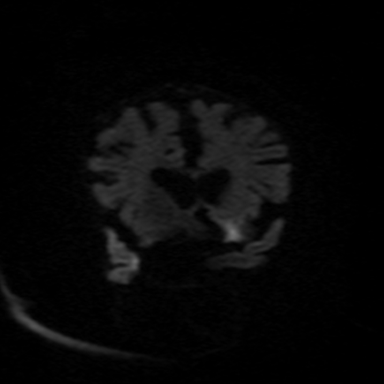
[im 35/35]
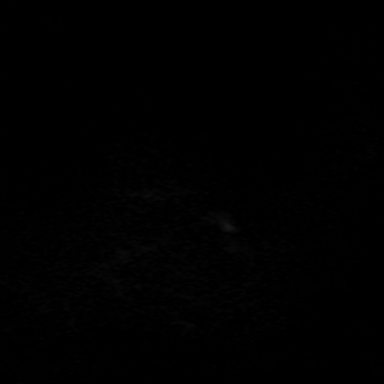

[Series 8: T2 · axial · 5.0mm · 0.75mm/px · z∈[-74,+68]mm · 3 of 23 slices shown (1 of 3)]
[im 1/23]
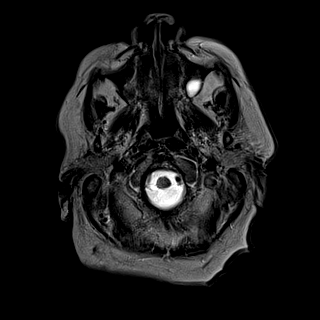
[im 12/23]
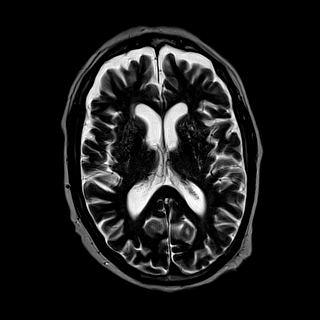
[im 23/23]
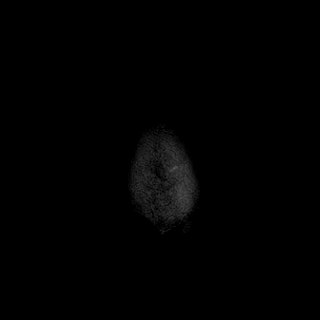

[Series 9: FLAIR · axial · 3.0mm · 0.90mm/px · z∈[-71,+66]mm · 5 of 47 slices shown]
[im 1/47]
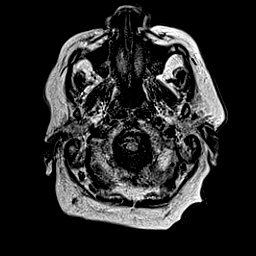
[im 12/47]
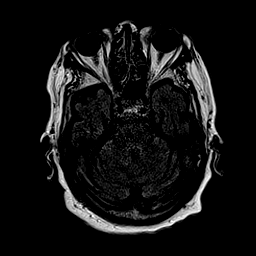
[im 24/47]
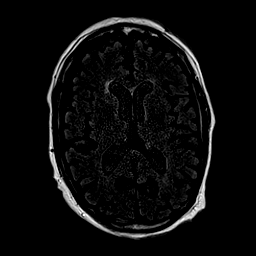
[im 35/47]
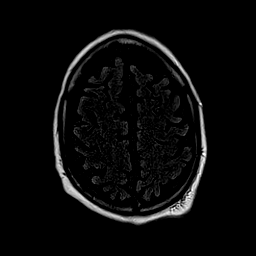
[im 47/47]
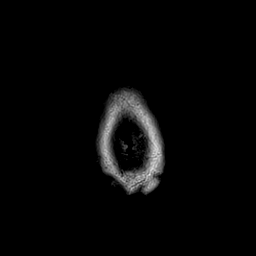

[Series 10: T2 · axial · 5.0mm · 0.43mm/px · z∈[-67,+62]mm · 2 of 21 slices shown (2 of 3)]
[im 1/21]
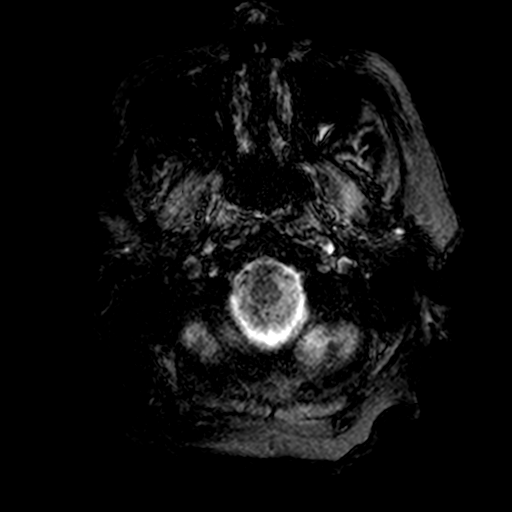
[im 21/21]
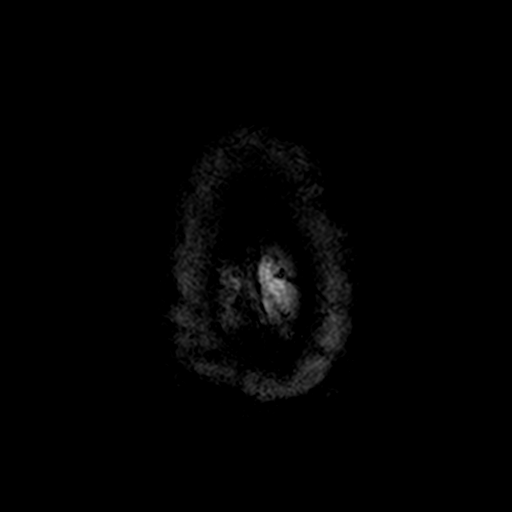

[Series 11: T1 · axial · 2.0mm · 0.45mm/px · z∈[-103,+64]mm · 7 of 104 slices shown (2 of 2)]
[im 1/104]
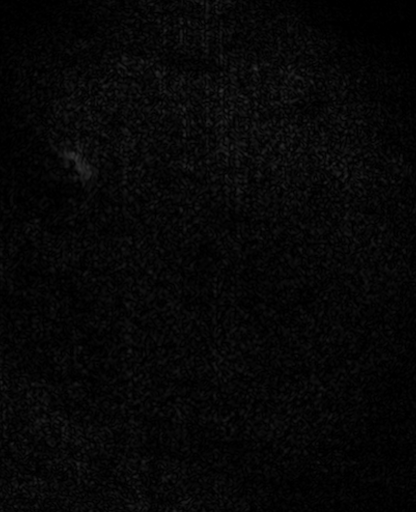
[im 19/104]
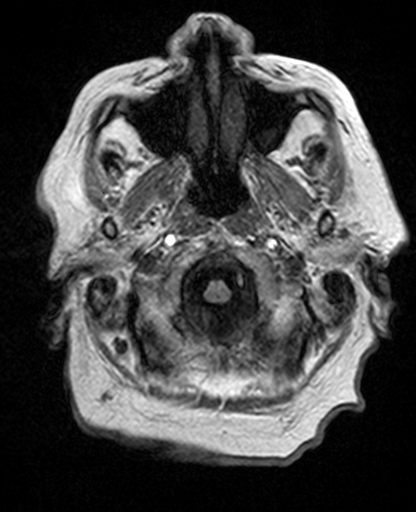
[im 29/104]
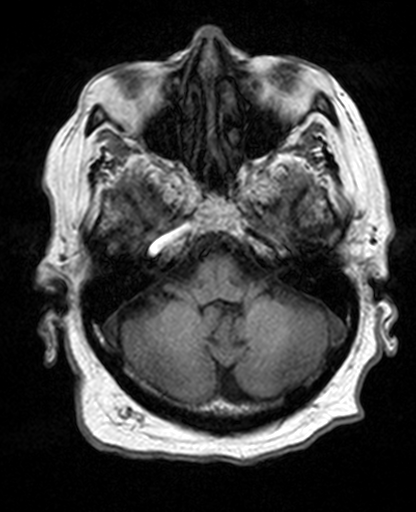
[im 47/104]
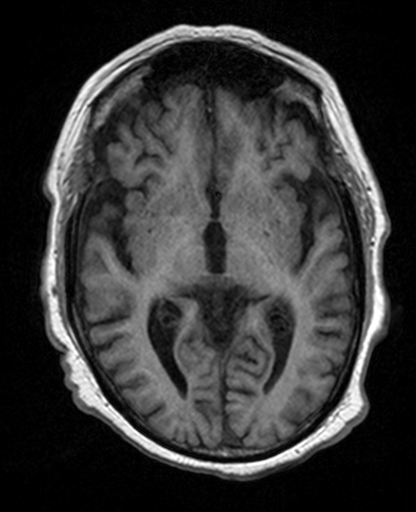
[im 57/104]
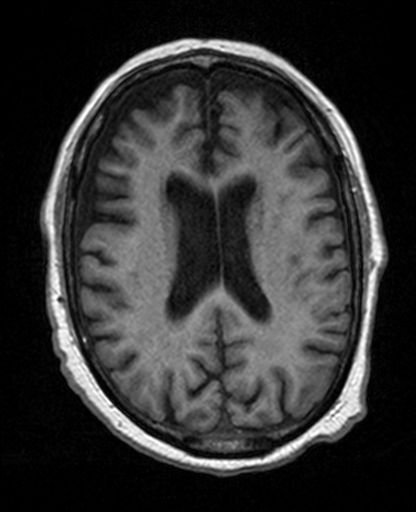
[im 75/104]
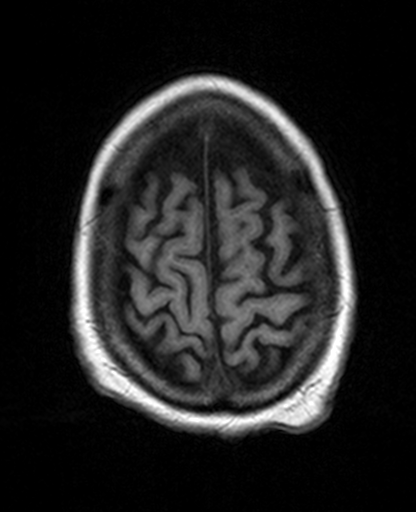
[im 85/104]
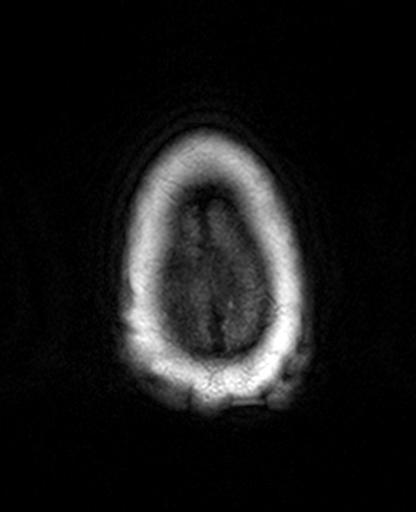

[Series 12: T2 · coronal · 5.0mm · 0.64mm/px · 3 of 28 slices shown (3 of 3)]
[im 1/28]
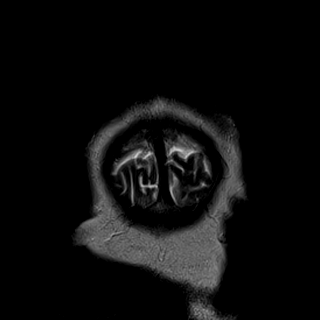
[im 14/28]
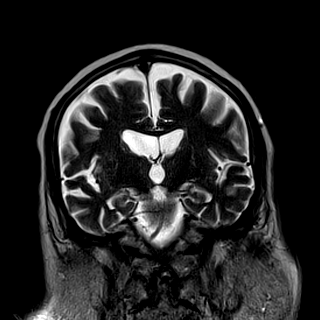
[im 28/28]
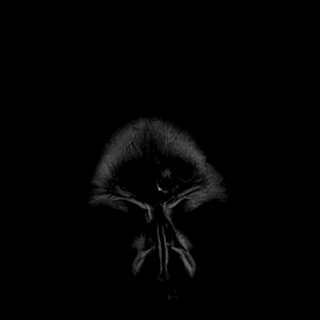

[33 of 48 positions shown; findings below may reference images not displayed]

FINDINGS: Brain: Mildly asymmetric extra-axial CSF is redemonstrated along the
right convexity, but on coronal T2 images there is no evidence of
displacement of the dural veins, no subdural collection (series 12,
image 17).

No restricted diffusion to suggest acute infarction. No midline
shift, mass effect, evidence of mass lesion, ventriculomegaly,
extra-axial collection or acute intracranial hemorrhage.
Cervicomedullary junction and pituitary are within normal limits.

Artifact suspected on T2* imaging (series 10, image 11). No
convincing cortical encephalomalacia or chronic cerebral blood
products. Mild T2 heterogeneity in the bilateral basal ganglia
mostly reflects perivascular spaces. Minimal nonspecific T2 and
FLAIR hyperintensity. Brainstem and cerebellum appear negative.

Vascular: Major intracranial vascular flow voids are preserved. The
left vertebral artery appears dominant.

Skull and upper cervical spine: Negative visible cervical spine and
bone marrow signal.

Sinuses/Orbits: Postoperative changes to both globes but otherwise
negative orbits. Mild paranasal sinus mucosal thickening.

Other: Mastoids are well pneumatized. Visible internal auditory
structures appear normal. Scalp and face soft tissues appear
negative.
IMPRESSION: No acute intracranial abnormality and largely unremarkable for age
noncontrast MRI appearance of the brain.

## 2022-02-26 ENCOUNTER — Emergency Department (HOSPITAL_COMMUNITY): Payer: Medicare HMO

## 2022-02-26 ENCOUNTER — Other Ambulatory Visit: Payer: Self-pay

## 2022-02-26 ENCOUNTER — Emergency Department (HOSPITAL_COMMUNITY)
Admission: EM | Admit: 2022-02-26 | Discharge: 2022-02-26 | Disposition: A | Discharge status: Home / Self Care | Payer: Medicare HMO | Attending: Emergency Medicine | Admitting: Emergency Medicine

## 2022-02-26 ENCOUNTER — Encounter (HOSPITAL_COMMUNITY): Payer: Self-pay | Admitting: Emergency Medicine

## 2022-02-26 DIAGNOSIS — W0110XA Fall on same level from slipping, tripping and stumbling with subsequent striking against unspecified object, initial encounter: Secondary | ICD-10-CM | POA: Diagnosis not present

## 2022-02-26 DIAGNOSIS — S61411A Laceration without foreign body of right hand, initial encounter: Secondary | ICD-10-CM | POA: Diagnosis not present

## 2022-02-26 DIAGNOSIS — Z7901 Long term (current) use of anticoagulants: Secondary | ICD-10-CM | POA: Insufficient documentation

## 2022-02-26 DIAGNOSIS — S6991XA Unspecified injury of right wrist, hand and finger(s), initial encounter: Secondary | ICD-10-CM | POA: Diagnosis present

## 2022-02-26 DIAGNOSIS — S20212A Contusion of left front wall of thorax, initial encounter: Secondary | ICD-10-CM | POA: Diagnosis not present

## 2022-02-26 DIAGNOSIS — Z7982 Long term (current) use of aspirin: Secondary | ICD-10-CM | POA: Insufficient documentation

## 2022-02-26 LAB — CBG MONITORING, ED: Glucose-Capillary: 115 mg/dL — ABNORMAL HIGH (ref 70–99)

## 2022-02-26 NOTE — ED Triage Notes (Signed)
Pt tripped and fell x 2 days ago. Denies hitting head. C/o pain to left rib/abd area and left ankle. Bruising and abrasion noted to left elbow and above. Abd distended but more swelling to left side than right which pt states is not his normal. Denies sob. A/o. Ambulating with cane.

## 2022-02-26 NOTE — ED Provider Notes (Signed)
[facility] EMERGENCY DEPARTMENT Provider Note   CSN: [id number] Arrival date & time: 02/26/22  1048     History  Chief Complaint  Patient presents with   Blake Collins is a 87 y.o. male.  Pt reports he slipped and fell 2 days ago.  Pt reports he hit his left ribs.  Pt reports he has soreness in left ribs.  Pt reports pain is worse when he sits up. Pt denies any impact of his head.  Patient denies any loss of consciousness.  Patient denies any abdominal pain.  Patient complains of a laceration to his right hand.  Patient thinks laceration is doing better.  Patient reports he is not having any difficulty breathing he does have pain when he moves and with deep breaths has no difficulty moving his hand.  Patient reports he turned his ankle but does not have any pain  The history is provided by the patient. No language interpreter was used.  Fall This is a new problem. The current episode started 2 days ago.       Home Medications Prior to Admission medications   Medication Sig Start Date End Date Taking? Authorizing Provider  acetaminophen (TYLENOL) 325 MG tablet Take 2 tablets (650 mg total) by mouth every 6 (six) hours as needed for mild pain or fever (or Fever >/= 101). 07/07/19   Shon Hale, MD  Apixaban Starter Pack, 10mg  and 5mg , (ELIQUIS DVT/PE STARTER PACK) Take as directed on package: start with two-5mg  tablets twice daily for 7 days. On day 8, switch to one-5mg  tablet twice daily. 07/07/19   , MD  aspirin EC 81 MG tablet Take 1 tablet (81 mg total) by mouth daily with breakfast. 07/07/19   Shon Hale, Courage, MD  atorvastatin (LIPITOR) 20 MG tablet Take 1 tablet (20 mg total) by mouth every evening. 07/07/19   Mariea Clonts, MD  fexofenadine (ALLEGRA) 180 MG tablet Take 1 tablet by mouth daily.    [provider]  gabapentin (NEURONTIN) 300 MG capsule Take 300 mg by mouth daily. 05/16/19   [provider]  HYDROcodone-acetaminophen  (NORCO/VICODIN) 5-325 MG tablet Take 1 tablet by mouth 2 (two) times daily as needed for moderate pain or severe pain. 07/07/19   05/18/19, MD  metFORMIN (GLUCOPHAGE) 1000 MG tablet Take 1,000 mg by mouth 2 (two) times daily with a meal.      [provider]  methocarbamol (ROBAXIN) 500 MG tablet Take 500 mg by mouth at bedtime.    [provider]  Omega-3 Fatty Acids (FISH OIL) 1000 MG CAPS Take 2 capsules by mouth in the morning and at bedtime.    [provider]  omeprazole (PRILOSEC) 40 MG capsule Take 40 mg by mouth 2 (two) times daily. 04/04/19   [provider]  zinc sulfate 220 (50 Zn) MG capsule Take 1 capsule (220 mg total) by mouth daily. 07/08/19   06/02/19, MD      Allergies    Patient has no known allergies.    Review of Systems   Review of Systems  All other systems reviewed and are negative.   Physical Exam Updated Vital Signs BP 106/66 (BP Location: Right Arm)   Pulse 91   Temp 98 F (36.7 C) (Oral)   Resp 17   SpO2 94%  Physical Exam Vitals and nursing note reviewed.  Constitutional:      Appearance: He is well-developed.  HENT:     Head:  Normocephalic.     Nose: Nose normal.     Mouth/Throat:     Mouth: Mucous membranes are moist.  Cardiovascular:     Rate and Rhythm: Normal rate.     Comments: Tender left chest wall no bruising, no specific rib tenderness.  Abdomen is soft and nontender with no bruising Pulmonary:     Effort: Pulmonary effort is normal.  Abdominal:     General: There is no distension.  Musculoskeletal:        General: Normal range of motion.     Cervical back: Normal range of motion.  Skin:    Comments: Patient has a 5 mm laceration to the crease between the right first and second finger, no gaping no sign of infection  Neurological:     Mental Status: He is alert and oriented to person, place, and time.  Psychiatric:        Mood and Affect: Mood normal.     ED Results / Procedures  / Treatments   Labs (all labs ordered are listed, but only abnormal results are displayed) Labs Reviewed  CBG MONITORING, ED - Abnormal; Notable for the following components:      Result Value   Glucose-Capillary 115 (*)    All other components within normal limits    EKG None  Radiology DG Ribs Unilateral W/Chest Left  Result Date: 02/26/2022 CLINICAL DATA:  Post fall. EXAM: LEFT RIBS AND CHEST - 3+ VIEW COMPARISON:  June 23, 2019 FINDINGS: No fracture or other bone lesions are seen involving the ribs. Low lung volumes are seen with mild diffuse, chronic appearing increased interstitial lung markings. Mild atelectasis is seen within the bilateral lung bases. Heart size and mediastinal contours are within normal limits. IMPRESSION: 1. No evidence of acute rib fracture. 2. Low lung volumes with mild bibasilar atelectasis. Electronically Signed   By: Aram Candela M.D.   On: 02/26/2022 12:03    Procedures Procedures    Medications Ordered in ED Medications - No data to display  ED Course/ Medical Decision Making/ A&P                           Medical Decision Making Patient reports he fell 2 days ago he has soreness in his left ribs  Amount and/or Complexity of Data Reviewed External Data Reviewed: notes.    Details: Reviewed Radiology: ordered and independent interpretation performed. Decision-making details documented in ED Course.    Details: X-ray shows no evidence of rib fracture  Risk Risk Details: Patient counseled on chest contusion wound care to right hand.  Patient is advised to follow-up with his primary care physician for recheck if any problems    Tetanus is up to date       Final Clinical Impression(s) / ED Diagnoses Final diagnoses:  Contusion of left chest wall, initial encounter  Laceration of right hand without foreign body, initial encounter    Rx / DC Orders ED Discharge Orders     None      An After Visit Summary was printed and  given to the patient.    Elson Areas, [place] 02/26/22 1246    Bethann Berkshire, MD 02/27/22 [phone]

## 2022-08-24 ENCOUNTER — Encounter: Payer: Self-pay | Admitting: Physician Assistant

## 2022-09-01 ENCOUNTER — Encounter: Payer: Self-pay | Admitting: Physician Assistant

## 2022-09-08 ENCOUNTER — Ambulatory Visit: Payer: Medicare HMO | Admitting: Physician Assistant

## 2022-09-08 ENCOUNTER — Encounter: Payer: Self-pay | Admitting: Physician Assistant

## 2022-09-08 ENCOUNTER — Other Ambulatory Visit (INDEPENDENT_AMBULATORY_CARE_PROVIDER_SITE_OTHER): Payer: Medicare HMO

## 2022-09-08 VITALS — BP 123/63 | HR 66 | Resp 20 | Ht 69.0 in

## 2022-09-08 DIAGNOSIS — R413 Other amnesia: Secondary | ICD-10-CM | POA: Diagnosis not present

## 2022-09-08 LAB — TSH: TSH: 3.3 u[IU]/mL (ref 0.35–5.50)

## 2022-09-08 LAB — VITAMIN B12: Vitamin B-12: 633 pg/mL (ref 211–911)

## 2022-09-08 MED ORDER — DONEPEZIL HCL 5 MG PO TABS: 5.0000 mg | ORAL_TABLET | Freq: Every morning | ORAL | 11 refills | Status: DC

## 2022-09-08 NOTE — Patient Instructions (Addendum)
It was a pleasure to see you today at our office.   Recommendations:  MRI of the brain disk for review Start Donepezil 5mg   daily. Side effects discussed   RECOMMEND NO FURTHER DRIVING Check labs today  Follow up in 3 months    For assessment of decision of mental capacity and competency:  Call Dr. Erick Blinks, geriatric psychiatrist at [phone] Counseling regarding caregiver distress, including caregiver depression, anxiety and issues regarding community resources, adult day care programs, adult living facilities, or memory care questions:  please contact your  Primary Doctor's Social Worker   Whom to call: Memory  decline, memory medications: Call our office [phone]   For psychiatric meds, mood meds: Please have your primary care physician manage these medications.  If you have any severe symptoms of a stroke, or other severe issues such as confusion,severe chills or fever, etc call 911 or go to the ER as you may need to be evaluated further    RECOMMENDATIONS FOR ALL PATIENTS WITH MEMORY PROBLEMS: 1. Continue to exercise (Recommend 30 minutes of walking everyday, or 3 hours every week) 2. Increase social interactions - continue going to [place] and enjoy social gatherings with friends and family 3. Eat healthy, avoid fried foods and eat more fruits and vegetables 4. Maintain adequate blood pressure, blood sugar, and blood cholesterol level. Reducing the risk of stroke and cardiovascular disease also helps promoting better memory. 5. Avoid stressful situations. Live a simple life and avoid aggravations. Organize your time and prepare for the next day in anticipation. 6. Sleep well, avoid any interruptions of sleep and avoid any distractions in the bedroom that may interfere with adequate sleep quality 7. Avoid sugar, avoid sweets as there is a strong link between excessive sugar intake, diabetes, and cognitive impairment We discussed the Mediterranean diet, which has been  shown to help patients reduce the risk of progressive memory disorders and reduces cardiovascular risk. This includes eating fish, eat fruits and green leafy vegetables, nuts like almonds and hazelnuts, walnuts, and also use olive oil. Avoid fast foods and fried foods as much as possible. Avoid sweets and sugar as sugar use has been linked to worsening of memory function.  There is always a concern of gradual progression of memory problems. If this is the case, then we may need to adjust level of care according to patient needs. Support, both to the patient and caregiver, should then be put into place.        FALL PRECAUTIONS: Be cautious when walking. Scan the area for obstacles that may increase the risk of trips and falls. When getting up in the mornings, sit up at the edge of the bed for a few minutes before getting out of bed. Consider elevating the bed at the head end to avoid drop of blood pressure when getting up. Walk always in a well-lit room (use night lights in the walls). Avoid area rugs or power cords from appliances in the middle of the walkways. Use a walker or a cane if necessary and consider physical therapy for balance exercise. Get your eyesight checked regularly.  FINANCIAL OVERSIGHT: Supervision, especially oversight when making financial decisions or transactions is also recommended.  HOME SAFETY: Consider the safety of the kitchen when operating appliances like stoves, microwave oven, and blender. Consider having supervision and share cooking responsibilities until no longer able to participate in those. Accidents with firearms and other hazards in the house should be identified and addressed as well.   ABILITY  TO BE LEFT ALONE: If patient is unable to contact 911 operator, consider using LifeLine, or when the need is there, arrange for someone to stay with patients. Smoking is a fire hazard, consider supervision or cessation. Risk of wandering should be assessed by caregiver  and if detected at any point, supervision and safe proof recommendations should be instituted.  MEDICATION SUPERVISION: Inability to self-administer medication needs to be constantly addressed. Implement a mechanism to ensure safe administration of the medications.   DRIVING: Regarding driving, in patients with progressive memory problems, driving will be impaired. We advise to have someone else do the driving if trouble finding directions or if minor accidents are reported. Independent driving assessment is available to determine safety of driving.   If you are interested in the driving assessment, you can contact the following:  [employer] in [place] [phone]  [facility] [phone]  [facility] [phone] or [phone]    Mediterranean Diet A Mediterranean diet refers to food and lifestyle choices that are based on the traditions of countries located on the [employer]. This way of eating has been shown to help prevent certain conditions and improve outcomes for people who have chronic diseases, like kidney disease and heart disease. What are tips for following this plan? Lifestyle  Cook and eat meals together with your family, when possible. Drink enough fluid to keep your urine clear or pale yellow. Be physically active every day. This includes: Aerobic exercise like running or swimming. Leisure activities like gardening, walking, or housework. Get 7-8 hours of sleep each night. If recommended by your health care provider, drink red wine in moderation. This means 1 glass a day for nonpregnant women and 2 glasses a day for men. A glass of wine equals 5 oz (150 mL). Reading food labels  Check the serving size of packaged foods. For foods such as rice and pasta, the serving size refers to the amount of cooked product, not dry. Check the total fat in packaged foods. Avoid foods that have  saturated fat or trans fats. Check the ingredients list for added sugars, such as corn syrup. Shopping  At the grocery store, buy most of your food from the areas near the walls of the store. This includes: Fresh fruits and vegetables (produce). Grains, beans, nuts, and seeds. Some of these may be available in unpackaged forms or large amounts (in bulk). Fresh seafood. Poultry and eggs. Low-fat dairy products. Buy whole ingredients instead of prepackaged foods. Buy fresh fruits and vegetables in-season from local farmers markets. Buy frozen fruits and vegetables in resealable bags. If you do not have access to quality fresh seafood, buy precooked frozen shrimp or canned fish, such as tuna, salmon, or sardines. Buy small amounts of raw or cooked vegetables, salads, or olives from the deli or salad bar at your store. Stock your pantry so you always have certain foods on hand, such as olive oil, canned tuna, canned tomatoes, rice, pasta, and beans. Cooking  Cook foods with extra-virgin olive oil instead of using butter or other vegetable oils. Have meat as a side dish, and have vegetables or grains as your main dish. This means having meat in small portions or adding small amounts of meat to foods like pasta or stew. Use beans or vegetables instead of meat in common dishes like chili or lasagna. Experiment with different cooking methods. Try roasting or broiling vegetables instead of steaming or sauteing them. Add frozen vegetables to  soups, stews, pasta, or rice. Add nuts or seeds for added healthy fat at each meal. You can add these to yogurt, salads, or vegetable dishes. Marinate fish or vegetables using olive oil, lemon juice, garlic, and fresh herbs. Meal planning  Plan to eat 1 vegetarian meal one day each week. Try to work up to 2 vegetarian meals, if possible. Eat seafood 2 or more times a week. Have healthy snacks readily available, such as: Vegetable sticks with hummus. Greek  yogurt. Fruit and nut trail mix. Eat balanced meals throughout the week. This includes: Fruit: 2-3 servings a day Vegetables: 4-5 servings a day Low-fat dairy: 2 servings a day Fish, poultry, or lean meat: 1 serving a day Beans and legumes: 2 or more servings a week Nuts and seeds: 1-2 servings a day Whole grains: 6-8 servings a day Extra-virgin olive oil: 3-4 servings a day Limit red meat and sweets to only a few servings a month What are my food choices? Mediterranean diet Recommended Grains: Whole-grain pasta. Brown rice. Bulgar wheat. Polenta. Couscous. Whole-wheat bread. Orpah Cobb. Vegetables: Artichokes. Beets. Broccoli. Cabbage. Carrots. Eggplant. Green beans. Chard. Kale. Spinach. Onions. Leeks. Peas. Squash. Tomatoes. Peppers. Radishes. Fruits: Apples. Apricots. Avocado. Berries. Bananas. Cherries. Dates. Figs. Grapes. Lemons. Melon. Oranges. Peaches. Plums. Pomegranate. Meats and other protein foods: Beans. Almonds. Sunflower seeds. Pine nuts. Peanuts. Cod. Salmon. Scallops. Shrimp. Tuna. Tilapia. Clams. Oysters. Eggs. Dairy: Low-fat milk. Cheese. Greek yogurt. Beverages: Water. Red wine. Herbal tea. Fats and oils: Extra virgin olive oil. Avocado oil. Grape seed oil. Sweets and desserts: [place] yogurt with honey. Baked apples. Poached pears. Trail mix. Seasoning and other foods: Basil. Cilantro. Coriander. Cumin. Mint. Parsley. Sage. Rosemary. Tarragon. Garlic. Oregano. Thyme. Pepper. Balsalmic vinegar. Tahini. Hummus. Tomato sauce. Olives. Mushrooms. Limit these Grains: Prepackaged pasta or rice dishes. Prepackaged cereal with added sugar. Vegetables: Deep fried potatoes (french fries). Fruits: Fruit canned in syrup. Meats and other protein foods: Beef. Pork. Lamb. Poultry with skin. Hot dogs. Tomasa Blase. Dairy: Ice cream. Sour cream. Whole milk. Beverages: Juice. Sugar-sweetened soft drinks. Beer. Liquor and spirits. Fats and oils: Butter. Canola oil. Vegetable oil. Beef  fat (tallow). Lard. Sweets and desserts: Cookies. Cakes. Pies. Candy. Seasoning and other foods: Mayonnaise. Premade sauces and marinades. The items listed may not be a complete list. Talk with your dietitian about what dietary choices are right for you. Summary The Mediterranean diet includes both food and lifestyle choices. Eat a variety of fresh fruits and vegetables, beans, nuts, seeds, and whole grains. Limit the amount of red meat and sweets that you eat. Talk with your health care provider about whether it is safe for you to drink red wine in moderation. This means 1 glass a day for nonpregnant women and 2 glasses a day for men. A glass of wine equals 5 oz (150 mL). This information is not intended to replace advice given to you by your health care provider. Make sure you discuss any questions you have with your health care provider. Document Released: 11/06/2015 Document Revised: 12/09/2015 Document Reviewed: 11/06/2015 Elsevier Interactive Patient Education  2017 [employer].

## 2022-09-08 NOTE — Progress Notes (Signed)
B12 and Thyroid normal, follow with PCP, thanks
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
## 2022-09-09 ENCOUNTER — Telehealth: Payer: Self-pay | Admitting: Physician Assistant
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
## 2022-12-09 ENCOUNTER — Encounter: Payer: Self-pay | Admitting: Physician Assistant

## 2022-12-09 ENCOUNTER — Ambulatory Visit: Payer: Medicare HMO | Admitting: Physician Assistant

## 2022-12-09 VITALS — BP 128/62 | HR 85 | Resp 20 | Ht 69.0 in | Wt 250.0 lb

## 2022-12-09 DIAGNOSIS — R413 Other amnesia: Secondary | ICD-10-CM | POA: Diagnosis not present

## 2022-12-09 MED ORDER — MEMANTINE HCL 5 MG PO TABS: ORAL_TABLET | ORAL | 11 refills | Status: DC

## 2022-12-09 NOTE — Patient Instructions (Addendum)
It was a pleasure to see you today at our office.   Recommendations:  Start Memantine 5mg  tablets.  Take 1 tablet at bedtime for 2 weeks, then 1 tablet twice daily.    RECOMMEND NO FURTHER DRIVING  Stop donepezil for a week prior to starting memantine  Follow up in 6 months    For assessment of decision of mental capacity and competency:  Call Dr. Erick Blinks, geriatric psychiatrist at [phone] Counseling regarding caregiver distress, including caregiver depression, anxiety and issues regarding community resources, adult day care programs, adult living facilities, or memory care questions:  please contact your  Primary Doctor's Social Worker   Whom to call: Memory  decline, memory medications: Call our office [phone]   For psychiatric meds, mood meds: Please have your primary care physician manage these medications.  If you have any severe symptoms of a stroke, or other severe issues such as confusion,severe chills or fever, etc call 911 or go to the ER as you may need to be evaluated further    RECOMMENDATIONS FOR ALL PATIENTS WITH MEMORY PROBLEMS: 1. Continue to exercise (Recommend 30 minutes of walking everyday, or 3 hours every week) 2. Increase social interactions - continue going to [place] and enjoy social gatherings with friends and family 3. Eat healthy, avoid fried foods and eat more fruits and vegetables 4. Maintain adequate blood pressure, blood sugar, and blood cholesterol level. Reducing the risk of stroke and cardiovascular disease also helps promoting better memory. 5. Avoid stressful situations. Live a simple life and avoid aggravations. Organize your time and prepare for the next day in anticipation. 6. Sleep well, avoid any interruptions of sleep and avoid any distractions in the bedroom that may interfere with adequate sleep quality 7. Avoid sugar, avoid sweets as there is a strong link between excessive sugar intake, diabetes, and cognitive impairment We  discussed the Mediterranean diet, which has been shown to help patients reduce the risk of progressive memory disorders and reduces cardiovascular risk. This includes eating fish, eat fruits and green leafy vegetables, nuts like almonds and hazelnuts, walnuts, and also use olive oil. Avoid fast foods and fried foods as much as possible. Avoid sweets and sugar as sugar use has been linked to worsening of memory function.  There is always a concern of gradual progression of memory problems. If this is the case, then we may need to adjust level of care according to patient needs. Support, both to the patient and caregiver, should then be put into place.        FALL PRECAUTIONS: Be cautious when walking. Scan the area for obstacles that may increase the risk of trips and falls. When getting up in the mornings, sit up at the edge of the bed for a few minutes before getting out of bed. Consider elevating the bed at the head end to avoid drop of blood pressure when getting up. Walk always in a well-lit room (use night lights in the walls). Avoid area rugs or power cords from appliances in the middle of the walkways. Use a walker or a cane if necessary and consider physical therapy for balance exercise. Get your eyesight checked regularly.  FINANCIAL OVERSIGHT: Supervision, especially oversight when making financial decisions or transactions is also recommended.  HOME SAFETY: Consider the safety of the kitchen when operating appliances like stoves, microwave oven, and blender. Consider having supervision and share cooking responsibilities until no longer able to participate in those. Accidents with firearms and other hazards in the  house should be identified and addressed as well.   ABILITY TO BE LEFT ALONE: If patient is unable to contact 911 operator, consider using LifeLine, or when the need is there, arrange for someone to stay with patients. Smoking is a fire hazard, consider supervision or cessation.  Risk of wandering should be assessed by caregiver and if detected at any point, supervision and safe proof recommendations should be instituted.  MEDICATION SUPERVISION: Inability to self-administer medication needs to be constantly addressed. Implement a mechanism to ensure safe administration of the medications.   DRIVING: Regarding driving, in patients with progressive memory problems, driving will be impaired. We advise to have someone else do the driving if trouble finding directions or if minor accidents are reported. Independent driving assessment is available to determine safety of driving.   If you are interested in the driving assessment, you can contact the following:  [employer] in [place] [phone]  [facility] [phone] or [phone]    Mediterranean Diet A Mediterranean diet refers to food and lifestyle choices that are based on the traditions of countries located on the [employer] [place]. This way of eating has been shown to help prevent certain conditions and improve outcomes for people who have chronic diseases, like kidney disease and heart disease. What are tips for following this plan? Lifestyle  Cook and eat meals together with your family, when possible. Drink enough fluid to keep your urine clear or pale yellow. Be physically active every day. This includes: Aerobic exercise like running or swimming. Leisure activities like gardening, walking, or housework. Get 7-8 hours of sleep each night. If recommended by your health care provider, drink red wine in moderation. This means 1 glass a day for nonpregnant women and 2 glasses a day for men. A glass of wine equals 5 oz (150 mL). Reading food labels  Check the serving size of packaged foods. For foods such as rice and pasta, the serving size refers to the amount of cooked product, not dry. Check the total fat in packaged foods. Avoid foods that have  saturated fat or trans fats. Check the ingredients list for added sugars, such as corn syrup. Shopping  At the grocery store, buy most of your food from the areas near the walls of the store. This includes: Fresh fruits and vegetables (produce). Grains, beans, nuts, and seeds. Some of these may be available in unpackaged forms or large amounts (in bulk). Fresh seafood. Poultry and eggs. Low-fat dairy products. Buy whole ingredients instead of prepackaged foods. Buy fresh fruits and vegetables in-season from local farmers markets. Buy frozen fruits and vegetables in resealable bags. If you do not have access to quality fresh seafood, buy precooked frozen shrimp or canned fish, such as tuna, salmon, or sardines. Buy small amounts of raw or cooked vegetables, salads, or olives from the deli or salad bar at your store. Stock your pantry so you always have certain foods on hand, such as olive oil, canned tuna, canned tomatoes, rice, pasta, and beans. Cooking  Cook foods with extra-virgin olive oil instead of using butter or other vegetable oils. Have meat as a side dish, and have vegetables or grains as your main dish. This means having meat in small portions or adding small amounts of meat to foods like pasta or stew. Use beans or vegetables instead of meat in common dishes like chili or lasagna. Experiment with different cooking methods. Try roasting or broiling vegetables instead of steaming or  sauteing them. Add frozen vegetables to soups, stews, pasta, or rice. Add nuts or seeds for added healthy fat at each meal. You can add these to yogurt, salads, or vegetable dishes. Marinate fish or vegetables using olive oil, lemon juice, garlic, and fresh herbs. Meal planning  Plan to eat 1 vegetarian meal one day each week. Try to work up to 2 vegetarian meals, if possible. Eat seafood 2 or more times a week. Have healthy snacks readily available, such as: Vegetable sticks with hummus. Greek  yogurt. Fruit and nut trail mix. Eat balanced meals throughout the week. This includes: Fruit: 2-3 servings a day Vegetables: 4-5 servings a day Low-fat dairy: 2 servings a day Fish, poultry, or lean meat: 1 serving a day Beans and legumes: 2 or more servings a week Nuts and seeds: 1-2 servings a day Whole grains: 6-8 servings a day Extra-virgin olive oil: 3-4 servings a day Limit red meat and sweets to only a few servings a month What are my food choices? Mediterranean diet Recommended Grains: Whole-grain pasta. Brown rice. Bulgar wheat. Polenta. Couscous. Whole-wheat bread. Orpah Cobb. Vegetables: Artichokes. Beets. Broccoli. Cabbage. Carrots. Eggplant. Green beans. Chard. Kale. Spinach. Onions. Leeks. Peas. Squash. Tomatoes. Peppers. Radishes. Fruits: Apples. Apricots. Avocado. Berries. Bananas. Cherries. Dates. Figs. Grapes. Lemons. Melon. Oranges. Peaches. Plums. Pomegranate. Meats and other protein foods: Beans. Almonds. Sunflower seeds. Pine nuts. Peanuts. Cod. Salmon. Scallops. Shrimp. Tuna. Tilapia. Clams. Oysters. Eggs. Dairy: Low-fat milk. Cheese. Greek yogurt. Beverages: Water. Red wine. Herbal tea. Fats and oils: Extra virgin olive oil. Avocado oil. Grape seed oil. Sweets and desserts: [place] yogurt with honey. Baked apples. Poached pears. Trail mix. Seasoning and other foods: Basil. Cilantro. Coriander. Cumin. Mint. Parsley. Sage. Rosemary. Tarragon. Garlic. Oregano. Thyme. Pepper. Balsalmic vinegar. Tahini. Hummus. Tomato sauce. Olives. Mushrooms. Limit these Grains: Prepackaged pasta or rice dishes. Prepackaged cereal with added sugar. Vegetables: Deep fried potatoes (french fries). Fruits: Fruit canned in syrup. Meats and other protein foods: Beef. Pork. Lamb. Poultry with skin. Hot dogs. Tomasa Blase. Dairy: Ice cream. Sour cream. Whole milk. Beverages: Juice. Sugar-sweetened soft drinks. Beer. Liquor and spirits. Fats and oils: Butter. Canola oil. Vegetable oil. Beef  fat (tallow). Lard. Sweets and desserts: Cookies. Cakes. Pies. Candy. Seasoning and other foods: Mayonnaise. Premade sauces and marinades. The items listed may not be a complete list. Talk with your dietitian about what dietary choices are right for you. Summary The Mediterranean diet includes both food and lifestyle choices. Eat a variety of fresh fruits and vegetables, beans, nuts, seeds, and whole grains. Limit the amount of red meat and sweets that you eat. Talk with your health care provider about whether it is safe for you to drink red wine in moderation. This means 1 glass a day for nonpregnant women and 2 glasses a day for men. A glass of wine equals 5 oz (150 mL). This information is not intended to replace advice given to you by your health care provider. Make sure you discuss any questions you have with your health care provider. Document Released: 11/06/2015 Document Revised: 12/09/2015 Document Reviewed: 11/06/2015 Elsevier Interactive Patient Education  2017 [employer].
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
## 2023-02-18 ENCOUNTER — Encounter: Payer: Self-pay | Admitting: *Deleted

## 2023-04-25 ENCOUNTER — Other Ambulatory Visit: Payer: Self-pay

## 2023-04-25 MED ORDER — MEMANTINE HCL 5 MG PO TABS: ORAL_TABLET | ORAL | 1 refills | Status: DC

## 2023-04-28 ENCOUNTER — Ambulatory Visit: Payer: Self-pay

## 2023-04-29 ENCOUNTER — Ambulatory Visit: Payer: Self-pay | Admitting: Family Medicine

## 2023-06-08 ENCOUNTER — Ambulatory Visit: Payer: Medicare HMO | Admitting: Physician Assistant

## 2023-06-08 ENCOUNTER — Encounter: Payer: Self-pay | Admitting: Physician Assistant

## 2023-06-08 VITALS — BP 138/69 | HR 77 | Ht 68.0 in | Wt 259.0 lb

## 2023-06-08 DIAGNOSIS — R413 Other amnesia: Secondary | ICD-10-CM

## 2023-06-08 NOTE — Patient Instructions (Signed)
 It was a pleasure to see you today at our office.   Recommendations:  Continue Memantine 5mg  tablets twice a day  Follow up in 6 months    For assessment of decision of mental capacity and competency:  Call Dr. Erick Blinks, geriatric psychiatrist at [phone] Counseling regarding caregiver distress, including caregiver depression, anxiety and issues regarding community resources, adult day care programs, adult living facilities, or memory care questions:  please contact your  Primary Doctor's Social Worker   Whom to call: Memory  decline, memory medications: Call our office [phone]   For psychiatric meds, mood meds: Please have your primary care physician manage these medications.  If you have any severe symptoms of a stroke, or other severe issues such as confusion,severe chills or fever, etc call 911 or go to the ER as you may need to be evaluated further    RECOMMENDATIONS FOR ALL PATIENTS WITH MEMORY PROBLEMS: 1. Continue to exercise (Recommend 30 minutes of walking everyday, or 3 hours every week) 2. Increase social interactions - continue going to [place] and enjoy social gatherings with friends and family 3. Eat healthy, avoid fried foods and eat more fruits and vegetables 4. Maintain adequate blood pressure, blood sugar, and blood cholesterol level. Reducing the risk of stroke and cardiovascular disease also helps promoting better memory. 5. Avoid stressful situations. Live a simple life and avoid aggravations. Organize your time and prepare for the next day in anticipation. 6. Sleep well, avoid any interruptions of sleep and avoid any distractions in the bedroom that may interfere with adequate sleep quality 7. Avoid sugar, avoid sweets as there is a strong link between excessive sugar intake, diabetes, and cognitive impairment We discussed the Mediterranean diet, which has been shown to help patients reduce the risk of progressive memory disorders and reduces  cardiovascular risk. This includes eating fish, eat fruits and green leafy vegetables, nuts like almonds and hazelnuts, walnuts, and also use olive oil. Avoid fast foods and fried foods as much as possible. Avoid sweets and sugar as sugar use has been linked to worsening of memory function.  There is always a concern of gradual progression of memory problems. If this is the case, then we may need to adjust level of care according to patient needs. Support, both to the patient and caregiver, should then be put into place.        FALL PRECAUTIONS: Be cautious when walking. Scan the area for obstacles that may increase the risk of trips and falls. When getting up in the mornings, sit up at the edge of the bed for a few minutes before getting out of bed. Consider elevating the bed at the head end to avoid drop of blood pressure when getting up. Walk always in a well-lit room (use night lights in the walls). Avoid area rugs or power cords from appliances in the middle of the walkways. Use a walker or a cane if necessary and consider physical therapy for balance exercise. Get your eyesight checked regularly.  FINANCIAL OVERSIGHT: Supervision, especially oversight when making financial decisions or transactions is also recommended.  HOME SAFETY: Consider the safety of the kitchen when operating appliances like stoves, microwave oven, and blender. Consider having supervision and share cooking responsibilities until no longer able to participate in those. Accidents with firearms and other hazards in the house should be identified and addressed as well.   ABILITY TO BE LEFT ALONE: If patient is unable to contact 911 operator, consider using LifeLine, or when  the need is there, arrange for someone to stay with patients. Smoking is a fire hazard, consider supervision or cessation. Risk of wandering should be assessed by caregiver and if detected at any point, supervision and safe proof recommendations should be  instituted.  MEDICATION SUPERVISION: Inability to self-administer medication needs to be constantly addressed. Implement a mechanism to ensure safe administration of the medications.   DRIVING: Regarding driving, in patients with progressive memory problems, driving will be impaired. We advise to have someone else do the driving if trouble finding directions or if minor accidents are reported. Independent driving assessment is available to determine safety of driving.   If you are interested in the driving assessment, you can contact the following:  [employer] in [place] [phone]  [facility] [phone] or [phone]    Mediterranean Diet A Mediterranean diet refers to food and lifestyle choices that are based on the traditions of countries located on the [employer]. This way of eating has been shown to help prevent certain conditions and improve outcomes for people who have chronic diseases, like kidney disease and heart disease. What are tips for following this plan? Lifestyle  Cook and eat meals together with your family, when possible. Drink enough fluid to keep your urine clear or pale yellow. Be physically active every day. This includes: Aerobic exercise like running or swimming. Leisure activities like gardening, walking, or housework. Get 7-8 hours of sleep each night. If recommended by your health care provider, drink red wine in moderation. This means 1 glass a day for nonpregnant women and 2 glasses a day for men. A glass of wine equals 5 oz (150 mL). Reading food labels  Check the serving size of packaged foods. For foods such as rice and pasta, the serving size refers to the amount of cooked product, not dry. Check the total fat in packaged foods. Avoid foods that have saturated fat or trans fats. Check the ingredients list for added sugars, such as corn syrup. Shopping  At the grocery store, buy  most of your food from the areas near the walls of the store. This includes: Fresh fruits and vegetables (produce). Grains, beans, nuts, and seeds. Some of these may be available in unpackaged forms or large amounts (in bulk). Fresh seafood. Poultry and eggs. Low-fat dairy products. Buy whole ingredients instead of prepackaged foods. Buy fresh fruits and vegetables in-season from local farmers markets. Buy frozen fruits and vegetables in resealable bags. If you do not have access to quality fresh seafood, buy precooked frozen shrimp or canned fish, such as tuna, salmon, or sardines. Buy small amounts of raw or cooked vegetables, salads, or olives from the deli or salad bar at your store. Stock your pantry so you always have certain foods on hand, such as olive oil, canned tuna, canned tomatoes, rice, pasta, and beans. Cooking  Cook foods with extra-virgin olive oil instead of using butter or other vegetable oils. Have meat as a side dish, and have vegetables or grains as your main dish. This means having meat in small portions or adding small amounts of meat to foods like pasta or stew. Use beans or vegetables instead of meat in common dishes like chili or lasagna. Experiment with different cooking methods. Try roasting or broiling vegetables instead of steaming or sauteing them. Add frozen vegetables to soups, stews, pasta, or rice. Add nuts or seeds for added healthy fat at each meal. You can add these to yogurt,  salads, or vegetable dishes. Marinate fish or vegetables using olive oil, lemon juice, garlic, and fresh herbs. Meal planning  Plan to eat 1 vegetarian meal one day each week. Try to work up to 2 vegetarian meals, if possible. Eat seafood 2 or more times a week. Have healthy snacks readily available, such as: Vegetable sticks with hummus. Greek yogurt. Fruit and nut trail mix. Eat balanced meals throughout the week. This includes: Fruit: 2-3 servings a day Vegetables: 4-5  servings a day Low-fat dairy: 2 servings a day Fish, poultry, or lean meat: 1 serving a day Beans and legumes: 2 or more servings a week Nuts and seeds: 1-2 servings a day Whole grains: 6-8 servings a day Extra-virgin olive oil: 3-4 servings a day Limit red meat and sweets to only a few servings a month What are my food choices? Mediterranean diet Recommended Grains: Whole-grain pasta. Brown rice. Bulgar wheat. Polenta. Couscous. Whole-wheat bread. Orpah Cobb. Vegetables: Artichokes. Beets. Broccoli. Cabbage. Carrots. Eggplant. Green beans. Chard. Kale. Spinach. Onions. Leeks. Peas. Squash. Tomatoes. Peppers. Radishes. Fruits: Apples. Apricots. Avocado. Berries. Bananas. Cherries. Dates. Figs. Grapes. Lemons. Melon. Oranges. Peaches. Plums. Pomegranate. Meats and other protein foods: Beans. Almonds. Sunflower seeds. Pine nuts. Peanuts. Cod. Salmon. Scallops. Shrimp. Tuna. Tilapia. Clams. Oysters. Eggs. Dairy: Low-fat milk. Cheese. Greek yogurt. Beverages: Water. Red wine. Herbal tea. Fats and oils: Extra virgin olive oil. Avocado oil. Grape seed oil. Sweets and desserts: [place] yogurt with honey. Baked apples. Poached pears. Trail mix. Seasoning and other foods: Basil. Cilantro. Coriander. Cumin. Mint. Parsley. Sage. Rosemary. Tarragon. Garlic. Oregano. Thyme. Pepper. Balsalmic vinegar. Tahini. Hummus. Tomato sauce. Olives. Mushrooms. Limit these Grains: Prepackaged pasta or rice dishes. Prepackaged cereal with added sugar. Vegetables: Deep fried potatoes (french fries). Fruits: Fruit canned in syrup. Meats and other protein foods: Beef. Pork. Lamb. Poultry with skin. Hot dogs. Tomasa Blase. Dairy: Ice cream. Sour cream. Whole milk. Beverages: Juice. Sugar-sweetened soft drinks. Beer. Liquor and spirits. Fats and oils: Butter. Canola oil. Vegetable oil. Beef fat (tallow). Lard. Sweets and desserts: Cookies. Cakes. Pies. Candy. Seasoning and other foods: Mayonnaise. Premade sauces and  marinades. The items listed may not be a complete list. Talk with your dietitian about what dietary choices are right for you. Summary The Mediterranean diet includes both food and lifestyle choices. Eat a variety of fresh fruits and vegetables, beans, nuts, seeds, and whole grains. Limit the amount of red meat and sweets that you eat. Talk with your health care provider about whether it is safe for you to drink red wine in moderation. This means 1 glass a day for nonpregnant women and 2 glasses a day for men. A glass of wine equals 5 oz (150 mL). This information is not intended to replace advice given to you by your health care provider. Make sure you discuss any questions you have with your health care provider. Document Released: 11/06/2015 Document Revised: 12/09/2015 Document Reviewed: 11/06/2015 Elsevier Interactive Patient Education  2017 [employer].
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
## 2023-10-27 ENCOUNTER — Encounter: Payer: Self-pay | Admitting: Physician Assistant

## 2023-10-27 ENCOUNTER — Ambulatory Visit: Admitting: Physician Assistant

## 2023-10-27 VITALS — BP 128/61 | HR 72 | Ht 70.0 in | Wt 259.0 lb

## 2023-10-27 DIAGNOSIS — R413 Other amnesia: Secondary | ICD-10-CM

## 2023-10-27 MED ORDER — MEMANTINE HCL 10 MG PO TABS
ORAL_TABLET | ORAL | 3 refills | Status: DC
Start: 2023-10-27 — End: 2023-10-27

## 2023-10-27 MED ORDER — MEMANTINE HCL 10 MG PO TABS
ORAL_TABLET | ORAL | 3 refills | Status: DC
Start: 2023-10-27 — End: 2023-12-30

## 2023-10-27 NOTE — Patient Instructions (Signed)
 It was a pleasure to see you today at our office.   Recommendations:  Increase  Memantine  to 10 mg tablets twice a day  Follow up in 6 months    For assessment of decision of mental capacity and competency:  Call Dr. Rosaline Nine, geriatric psychiatrist at [phone] Counseling regarding caregiver distress, including caregiver depression, anxiety and issues regarding community resources, adult day care programs, adult living facilities, or memory care questions:  please contact your  Primary Doctor's Social Worker   Whom to call: Memory  decline, memory medications: Call our office [phone]   For psychiatric meds, mood meds: Please have your primary care physician manage these medications.  If you have any severe symptoms of a stroke, or other severe issues such as confusion,severe chills or fever, etc call 911 or go to the ER as you may need to be evaluated further    RECOMMENDATIONS FOR ALL PATIENTS WITH MEMORY PROBLEMS: 1. Continue to exercise (Recommend 30 minutes of walking everyday, or 3 hours every week) 2. Increase social interactions - continue going to [place] and enjoy social gatherings with friends and family 3. Eat healthy, avoid fried foods and eat more fruits and vegetables 4. Maintain adequate blood pressure, blood sugar, and blood cholesterol level. Reducing the risk of stroke and cardiovascular disease also helps promoting better memory. 5. Avoid stressful situations. Live a simple life and avoid aggravations. Organize your time and prepare for the next day in anticipation. 6. Sleep well, avoid any interruptions of sleep and avoid any distractions in the bedroom that may interfere with adequate sleep quality 7. Avoid sugar, avoid sweets as there is a strong link between excessive sugar intake, diabetes, and cognitive impairment We discussed the Mediterranean diet, which has been shown to help patients reduce the risk of progressive memory disorders and reduces  cardiovascular risk. This includes eating fish, eat fruits and green leafy vegetables, nuts like almonds and hazelnuts, walnuts, and also use olive oil. Avoid fast foods and fried foods as much as possible. Avoid sweets and sugar as sugar use has been linked to worsening of memory function.  There is always a concern of gradual progression of memory problems. If this is the case, then we may need to adjust level of care according to patient needs. Support, both to the patient and caregiver, should then be put into place.        FALL PRECAUTIONS: Be cautious when walking. Scan the area for obstacles that may increase the risk of trips and falls. When getting up in the mornings, sit up at the edge of the bed for a few minutes before getting out of bed. Consider elevating the bed at the head end to avoid drop of blood pressure when getting up. Walk always in a well-lit room (use night lights in the walls). Avoid area rugs or power cords from appliances in the middle of the walkways. Use a walker or a cane if necessary and consider physical therapy for balance exercise. Get your eyesight checked regularly.  FINANCIAL OVERSIGHT: Supervision, especially oversight when making financial decisions or transactions is also recommended.  HOME SAFETY: Consider the safety of the kitchen when operating appliances like stoves, microwave oven, and blender. Consider having supervision and share cooking responsibilities until no longer able to participate in those. Accidents with firearms and other hazards in the house should be identified and addressed as well.   ABILITY TO BE LEFT ALONE: If patient is unable to contact 911 operator, consider using  [employer], or when the need is there, arrange for someone to stay with patients. Smoking is a fire hazard, consider supervision or cessation. Risk of wandering should be assessed by caregiver and if detected at any point, supervision and safe proof recommendations should be  instituted.  MEDICATION SUPERVISION: Inability to self-administer medication needs to be constantly addressed. Implement a mechanism to ensure safe administration of the medications.   DRIVING: Regarding driving, in patients with progressive memory problems, driving will be impaired. We advise to have someone else do the driving if trouble finding directions or if minor accidents are reported. Independent driving assessment is available to determine safety of driving.   If you are interested in the driving assessment, you can contact the following:  [employer] in [place] [phone]  [facility] [phone] or [phone]    Mediterranean Diet A Mediterranean diet refers to food and lifestyle choices that are based on the traditions of countries located on the [employer]. This way of eating has been shown to help prevent certain conditions and improve outcomes for people who have chronic diseases, like kidney disease and heart disease. What are tips for following this plan? Lifestyle  Cook and eat meals together with your family, when possible. Drink enough fluid to keep your urine clear or pale yellow. Be physically active every day. This includes: Aerobic exercise like running or swimming. Leisure activities like gardening, walking, or housework. Get 7-8 hours of sleep each night. If recommended by your health care provider, drink red wine in moderation. This means 1 glass a day for nonpregnant women and 2 glasses a day for men. A glass of wine equals 5 oz (150 mL). Reading food labels  Check the serving size of packaged foods. For foods such as rice and pasta, the serving size refers to the amount of cooked product, not dry. Check the total fat in packaged foods. Avoid foods that have saturated fat or trans fats. Check the ingredients list for added sugars, such as corn syrup. Shopping  At the grocery store, buy  most of your food from the areas near the walls of the store. This includes: Fresh fruits and vegetables (produce). Grains, beans, nuts, and seeds. Some of these may be available in unpackaged forms or large amounts (in bulk). Fresh seafood. Poultry and eggs. Low-fat dairy products. Buy whole ingredients instead of prepackaged foods. Buy fresh fruits and vegetables in-season from local farmers markets. Buy frozen fruits and vegetables in resealable bags. If you do not have access to quality fresh seafood, buy precooked frozen shrimp or canned fish, such as tuna, salmon, or sardines. Buy small amounts of raw or cooked vegetables, salads, or olives from the deli or salad bar at your store. Stock your pantry so you always have certain foods on hand, such as olive oil, canned tuna, canned tomatoes, rice, pasta, and beans. Cooking  Cook foods with extra-virgin olive oil instead of using butter or other vegetable oils. Have meat as a side dish, and have vegetables or grains as your main dish. This means having meat in small portions or adding small amounts of meat to foods like pasta or stew. Use beans or vegetables instead of meat in common dishes like chili or lasagna. Experiment with different cooking methods. Try roasting or broiling vegetables instead of steaming or sauteing them. Add frozen vegetables to soups, stews, pasta, or rice. Add nuts or seeds for added healthy fat at each meal. You can add  these to yogurt, salads, or vegetable dishes. Marinate fish or vegetables using olive oil, lemon juice, garlic, and fresh herbs. Meal planning  Plan to eat 1 vegetarian meal one day each week. Try to work up to 2 vegetarian meals, if possible. Eat seafood 2 or more times a week. Have healthy snacks readily available, such as: Vegetable sticks with hummus. Greek yogurt. Fruit and nut trail mix. Eat balanced meals throughout the week. This includes: Fruit: 2-3 servings a day Vegetables: 4-5  servings a day Low-fat dairy: 2 servings a day Fish, poultry, or lean meat: 1 serving a day Beans and legumes: 2 or more servings a week Nuts and seeds: 1-2 servings a day Whole grains: 6-8 servings a day Extra-virgin olive oil: 3-4 servings a day Limit red meat and sweets to only a few servings a month What are my food choices? Mediterranean diet Recommended Grains: Whole-grain pasta. Brown rice. Bulgar wheat. Polenta. Couscous. Whole-wheat bread. [place]. Vegetables: Artichokes. Beets. Broccoli. Cabbage. Carrots. Eggplant. Green beans. Chard. Kale. Spinach. Onions. Leeks. Peas. Squash. Tomatoes. Peppers. Radishes. Fruits: Apples. Apricots. Avocado. Berries. Bananas. Cherries. Dates. Figs. Grapes. Lemons. Melon. Oranges. Peaches. Plums. Pomegranate. Meats and other protein foods: Beans. Almonds. Sunflower seeds. Pine nuts. Peanuts. Cod. Salmon. Scallops. Shrimp. Tuna. Tilapia. Clams. Oysters. Eggs. Dairy: Low-fat milk. Cheese. Greek yogurt. Beverages: Water. Red wine. Herbal tea. Fats and oils: Extra virgin olive oil. Avocado oil. Grape seed oil. Sweets and desserts: [place] yogurt with honey. Baked apples. Poached pears. Trail mix. Seasoning and other foods: Basil. Cilantro. Coriander. Cumin. Mint. Parsley. Sage. Rosemary. Tarragon. Garlic. Oregano. Thyme. Pepper. Balsalmic vinegar. Tahini. Hummus. Tomato sauce. Olives. Mushrooms. Limit these Grains: Prepackaged pasta or rice dishes. Prepackaged cereal with added sugar. Vegetables: Deep fried potatoes (french fries). Fruits: Fruit canned in syrup. Meats and other protein foods: Beef. Pork. Lamb. Poultry with skin. Hot dogs. [place]. Dairy: Ice cream. Sour cream. Whole milk. Beverages: Juice. Sugar-sweetened soft drinks. Beer. Liquor and spirits. Fats and oils: Butter. [place]ola oil. Vegetable oil. Beef fat (tallow). Lard. Sweets and desserts: Cookies. Cakes. Pies. Candy. Seasoning and other foods: Mayonnaise. Premade sauces and  marinades. The items listed may not be a complete list. Talk with your dietitian about what dietary choices are right for you. Summary The Mediterranean diet includes both food and lifestyle choices. Eat a variety of fresh fruits and vegetables, beans, nuts, seeds, and whole grains. Limit the amount of red meat and sweets that you eat. Talk with your health care provider about whether it is safe for you to drink red wine in moderation. This means 1 glass a day for nonpregnant women and 2 glasses a day for men. A glass of wine equals 5 oz (150 mL). This information is not intended to replace advice given to you by your health care provider. Make sure you discuss any questions you have with your health care provider. Document Released: 11/06/2015 Document Revised: 12/09/2015 Document Reviewed: 11/06/2015 Elsevier Interactive Patient Education  2017 [employer].
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
## 2023-12-08 ENCOUNTER — Ambulatory Visit: Admitting: Physician Assistant

## 2023-12-15 NOTE — ED Provider Notes (Signed)
 ED Progress Note  Patient has been seen and evaluated by case management/social worker as well as by hospitalist Dr. Bettejane.  The patient does not need to be admitted.  Patient cannot be placed in an outpatient facility directly from the ER at this point.  Additional support request was provided.  Patient at this time is stable without any distress or discomfort does not meet criteria for admission.  Will be discharged with close follow-up with social worker and primary care physician as soon as possible.  Also needs to follow-up with a neurologist regarding the dementia.

## 2023-12-15 NOTE — Consults (Signed)
 CONSULT NOTE [facility]   12/15/23     Patient name: Blake Collins. Blake Collins DOB 14-Dec-1934 MRN#: [id number] PCP: Baird Comer Brandy, ANP Date: 12/15/23 Time: 12:53 PM  Primary Care Provider:  Baird Comer Brandy, ANP  _________________________________________________________________________    Consult HPI    Reason for consult: Possible admission for altered mental status  HPI:   Blake Collins  is a 89 y.o. y.o. male with a PMH significant for dementia, hypertension, type 2 diabetes who presented to [facility] on 12/15/2023.  The patient was brought to the hospital after a fall.  The patient is a poor historian due to his dementia.  History is obtained from family.  They felt that the patient was more confused than usual.  He is also more agitated than usual.  He has been complaining of back pain.  The patient usually is fully independent, take his own medications, takes his own blood sugars.  However he does have dementia and follows with a neurologist.  They did increase his memantine  at the end of July due to worsening memory problems.  The patient also reported to the son that he had blood in his urine a couple of days prior to this admission.  The son tried to give an over-the-counter medication to help clear out the kidneys.  The family feels that something is definitely wrong and that this is leading to the patient's change in mental status.    Mental Status on Initial Consult Visit: The patient is Alert and oriented to PERSON The patient is not Alert And oriented to TIME The patient is not Alert and oriented to LOCATION   Problem List, Assessment & Plan    ASSESSMENT & PLAN (In order of descending acuity)  Acute delirium - Unable to find an organic cause for patient's acute delirium at this time - Urinalysis is negative - Labs appear at baseline for the patient - Discussed with family that this is something that happens with patients with dementia,  and this is a normal part of the process especially with progression - Recommend using Zyprexa as needed at home for periods of agitation and sundowning, this was sent to patient's pharmacy - I also discussed with family that I cannot guarantee that the delirium will get initially better, and that if there are still significant concerns and they do not the patient reevaluated - At this time there is no acute reason for medical admission, so I cannot recommend that to the family - There is also risks to medical admission, including infections, worsening of the delirium, worsening physical debility  Dementia - Patient has moderate neurocognitive disorder at baseline, and likely has current progression - He follows with a neurologist as an outpatient and is on memantine  - Family is concerned that he has had worsening of his mental status, and it was discussed that dementia is a progressive disease and his mental status will continue to worsen - Case manager was consulted to help provide the family with resources  Type 2 diabetes - Blood sugars seem to be well-controlled on BMP - Continue current medications     ADDITIONAL NON-ACUTE FINDINGS, OBSERVATIONS, FAMILY DISCUSSIONS, ETC. (When present):  Patient resting comfortably in bed, does have periods of hallucinations He is alert and oriented to himself, not oriented to time or place Patient does have some scattered bruises noted on his skin Heart is regular rate and rhythm, no murmurs rubs or gallops Lungs are clear to auscultation Abdomen soft, nontender, nondistended  Temp:  [36.9 C (98.4 F)] 36.9 C (98.4 F) Pulse:  [91] 91 SpO2 Pulse:  [84] 84 Resp:  [22] 22 BP: (133-148)/(68-78) 133/68 SpO2:  [99 %] 99 % There is no height or weight on file to calculate BMI. Intake/Output last 3 shifts: No intake/output data recorded.  Consults Requested  IP CONSULT TO SOCIAL WORK    CODE STATUS :                    Full Code    __________________________________________________________________________   I have discussed this case fully with the referring provider for 5 minutes.   _________________________________________________________ __________________________________________________________  No Known Allergies   Past Medical History[1]  Past Surgical History[2]   Family History[3]   Current Medications[4]  REFER TO EPIC FOR FULL LIST OF CURRENT MEDICATIONS ORDERED ON ADMISSION. THESE ORDERS APPEAR ONLY WHEN RELEASED, WHICH MAY HAPPEN AFTER ADMISSION ONCE PATIENT IS TRANSFERRED FROM THE ED.   Allergies  Allergies[5]    Imaging  XR Pelvis 1 Or 2 Views Result Date: 12/15/2023 Exam:  Pelvis  History:  Injury, fall  Technique:  AP pelvis  Comparison:  10/04/2007  Findings:  No evidence of an acute fracture. No joint dislocation or acute malalignment. There is moderate degenerative narrowing of the joint spaces in both hips. Pubic symphysis and sacroiliac joints are appropriately aligned. Multilevel degenerative changes in the lower lumbar spine.     Negative for fracture or other acute process.    Signed (Electronic Signature): 12/15/2023 6:05 AM Signed By: Emeline KATHEE Milroy, MD  XR Knee 1 or 2 Views Bilateral Result Date: 12/15/2023 Exam:  Bilateral Knees  History:  Fall  Technique:  Frontal and lateral views of each knee  Comparison:  None.  Findings:  RIGHT KNEE:  No evidence of an acute fracture. No joint dislocation or acute malalignment. Advanced degenerative narrowing of the medial tibiofemoral joint space. Tricompartmental marginal osteophytes. Soft tissues are grossly unremarkable.  LEFT KNEE:  No acute fracture or dislocation. Moderate degenerative narrowing of the tibiofemoral joint spaces and patellofemoral joint space narrowing. There is no visible effusion. Soft tissues are grossly unremarkable.     1.    Negative for fracture or other acute process.  2.    Bilateral osteoarthritis.    Signed  (Electronic Signature): 12/15/2023 6:04 AM Signed By: Emeline KATHEE Milroy, MD  XR Chest Portable Result Date: 12/15/2023 Exam:  Chest Single Frontal View  History:  Fall  Technique: Single view chest  Comparison:  08/05/2022  Findings:   Cardiac silhouette is enlarged and partially obscures the left lung base. There is no visible effusion or pneumothorax. Regional bones appear intact.     Mildly enlarged cardiac silhouette. No additional acute findings.    Signed (Electronic Signature): 12/15/2023 6:03 AM Signed By: Emeline KATHEE Milroy, MD    Lab Results   Recent Labs    12/15/23 0623  WBC 8.8  HGB 11.2*  HCT 35.1*  PLT 133*   Recent Labs    12/15/23 0623  NA 144  K 3.7  CL 109*  CO2 26.6  BUN 25*  CREATININE 1.30  GLU 114  CALCIUM  8.7  ALBUMIN 3.3*  PROT 6.8  BILITOT 1.3*  AST 32  ALT 24  ALKPHOS 99  MG 1.5*   No results for input(s): CKTOTAL, CKMB, PCTCKMB, TROPONINI, EDTPNI, BNP, INR, LABPROT, APTT, DDIMER in the last 72 hours. Recent Labs    12/15/23 0635  WBCUA 3  NITRITE Negative  LEUKOCYTESUR  Negative  BACTERIA None Seen  RBCUA 2  BLOODU Negative  GLUCOSEU Negative  PROTEINUA Trace*  KETONESU 10 mg/dL*   No results for input(s): OPIAU, BENZU, TRICYCLIC, PCPU, AMPHU, COCAU, CANNAU, BARBU, ETOH, ACETAMIN, SALICYLATE in the last 72 hours. No results for input(s): PREGTESTUR, PREGPOC in the last 72 hours. No results for input(s): OCCULTBLD, RAPSCRN, CDIFRPCR, CDIFFNAP1, A1C, CHOL, LDL, HDL, TRIG in the last 72 hours. No results for input(s): O2SOUR, FIO2ART, PHART, PCO2ART, PO2ART, HCO3ART, O2SATART, BEART in the last 72 hours.    Home Medications   Prior to Admission medications  Medication Dose, Route, Frequency  celecoxib (CELEBREX) 200 MG capsule 200 mg, 2 times a day (standard)  memantine  (NAMENDA ) 5 MG tablet 5 mg, 2 times a day (standard)  acetaminophen  (TYLENOL ) 325 MG tablet  650 mg, Oral, Every 6 hours PRN  ascorbic acid , vitamin C, 500 mg cap 1 capsule, Oral, Daily  aspirin  (ECOTRIN) 81 MG tablet 81 mg, Oral, Daily (standard)  atorvastatin  (LIPITOR) 20 MG tablet 20 mg, Oral, Daily  cyanocobalamin  50 mcg tablet 50 mcg, Oral, Daily  diclofenac sodium (VOLTAREN) 1 % gel 2 grams two to four times daily to painful joint  docosahexaenoic acid-epa 120-180 mg cap 2 capsules, Oral, 2 times a day  ferrous sulfate 325 (65 FE) MG tablet 325 mg, Oral, Daily  fexofenadine (ALLEGRA) 180 MG tablet 180 mg, Oral, Daily (standard)  furosemide (LASIX) 20 MG tablet 1 tablet, Oral, Daily (standard)  gabapentin (NEURONTIN) 300 MG capsule 1 capsule, Oral, 3 times daily (RT)  metFORMIN (GLUCOPHAGE) 1000 MG tablet 1,000 mg, Oral, 2 times a day  methocarbamoL (ROBAXIN) 500 MG tablet No dose, route, or frequency recorded.  omeprazole (PRILOSEC) 40 MG capsule 1 capsule, Oral, 2 times a day  zinc  sulfate (ZINCATE) 220 mg (50 mg elemental zinc ) capsule 220 mg, Oral, Daily   Donnice SHAUNNA Para, DO Hospitalist, [facility] 12/15/23, 12:53 PM       [1] Past Medical History: Diagnosis Date  . Diabetes    (CMS-HCC)   . Hypertension   . Pulmonary embolism    (CMS-HCC)   [2] Past Surgical History: Procedure Laterality Date  . CATARACT EXTRACTION    . FOOT SURGERY Bilateral    bilateral  . HERNIA REPAIR    [3] No family history on file. [4]  Current Facility-Administered Medications:  .  hydrOXYzine (ATARAX) tablet 25 mg, 25 mg, Oral, Once, Shah, Abid Ali, MD  Current Outpatient Medications:  .  celecoxib (CELEBREX) 200 MG capsule, Take 1 capsule (200 mg total) by mouth two (2) times a day., Disp: , Rfl:  .  memantine  (NAMENDA ) 5 MG tablet, Take 1 tablet (5 mg total) by mouth two (2) times a day., Disp: , Rfl:  .  acetaminophen  (TYLENOL ) 325 MG tablet, Take 650 mg by mouth every six (6) hours as needed., Disp: , Rfl:  .  ascorbic acid , vitamin C, 500 mg cap, Take 1 capsule by mouth  daily., Disp: , Rfl:  .  aspirin  (ECOTRIN) 81 MG tablet, Take 1 tablet (81 mg total) by mouth daily., Disp: , Rfl:  .  atorvastatin  (LIPITOR) 20 MG tablet, Take 1 tablet (20 mg total) by mouth in the morning., Disp: , Rfl:  .  cyanocobalamin  50 mcg tablet, Take 50 mcg by mouth daily., Disp: , Rfl:  .  diclofenac sodium (VOLTAREN) 1 % gel, 2 grams two to four times daily to painful joint, Disp: , Rfl:  .  docosahexaenoic acid-epa 120-180 mg cap,  Take 2 capsules by mouth two (2) times a day., Disp: , Rfl:  .  ferrous sulfate 325 (65 FE) MG tablet, Take 1 tablet (325 mg total) by mouth in the morning., Disp: , Rfl:  .  fexofenadine (ALLEGRA) 180 MG tablet, Take 1 tablet (180 mg total) by mouth daily., Disp: , Rfl:  .  furosemide (LASIX) 20 MG tablet, Take 1 tablet (20 mg total) by mouth daily., Disp: , Rfl:  .  gabapentin (NEURONTIN) 300 MG capsule, Take 1 capsule (300 mg total) by mouth in the morning and 1 capsule (300 mg total) at noon and 1 capsule (300 mg total) in the evening., Disp: , Rfl:  .  metFORMIN (GLUCOPHAGE) 1000 MG tablet, Take 1 tablet (1,000 mg total) by mouth two (2) times a day., Disp: , Rfl:  .  methocarbamoL (ROBAXIN) 500 MG tablet, , Disp: , Rfl:  .  omeprazole (PRILOSEC) 40 MG capsule, Take 1 capsule (40 mg total) by mouth Two (2) times a day., Disp: , Rfl:  .  zinc  sulfate (ZINCATE) 220 mg (50 mg elemental zinc ) capsule, Take 220 mg by mouth daily., Disp: , Rfl:  [5] No Known Allergies

## 2023-12-15 NOTE — ED Provider Notes (Addendum)
 Emergency Department Provider Note    ED Clinical Impression   Final diagnoses:  Fall, initial encounter (Primary)  Contusion of right knee, initial encounter    ED Assessment/Plan    Condition: Stable Disposition: Discharge  This chart has been completed using Dragon Medical Dictation software, and while attempts have been made to ensure accuracy, certain words and phrases may not be transcribed as intended.   History   Chief Complaint  Patient presents with  . Fall    From home via ems c/o fall in the bath tub this morning. Patient presents alert-oriented to self which is base line. No complaints of pain. No obvious deformity or injury upon arrival    HPI  Blake Collins is a 89 y.o. male  who presents today to the  emergency department complaining of a fall that occurred today in his bathtub.  Patient apparently slipped and fell.  He denies hitting his head.  He has no complaints.  EMS noticed some bruising on his knee.  Patient has a history of dementia and cannot give a good history.    Allergies: has no known allergies. Medications: has a current medication list which includes the following long-term medication(s): memantine , aspirin , atorvastatin , furosemide, gabapentin, and metformin. PMHx:  has a past medical history of Diabetes    (CMS-HCC), Hypertension, and Pulmonary embolism    (CMS-HCC). PSHx:  has a past surgical history that includes Cataract extraction; Hernia repair; and Foot surgery (Bilateral). SocHx:  reports that he quit smoking about 43 years ago. His smoking use included cigarettes. He has never used smokeless tobacco. He reports that he does not drink alcohol and does not use drugs. Allergies, Medications, Medical, Surgical, and Social History were reviewed as documented above.   Social Drivers of Health with Concerns   Tobacco Use: Medium Risk (10/27/2023)    Received from [facility]   Patient History   . Smoking Tobacco Use: Former   . Smokeless Tobacco Use: Never   . Passive Exposure: Not on file  Physical Activity: Unknown (09/09/2022)   Received from [facility]   Exercise Vital Sign   . On average, how many days per week do you engage in moderate to strenuous exercise (like a brisk walk)?: 0 days   . Minutes of Exercise per Session: Not on file  Interpersonal Safety: Not on file  Substance Use: Not on file (02/02/2023)  Health Literacy: High Risk (11/18/2020)   Health Literacy   . : Often  Internet Connectivity: Not on file     Review Of Systems  Review of Systems  Constitutional:  Negative for fever.  HENT:  Negative for congestion.   Respiratory:  Negative for chest tightness and shortness of breath.   Cardiovascular:  Negative for chest pain.  Gastrointestinal:  Negative for abdominal pain.  Skin:  Negative for color change.  Neurological:  Negative for weakness and headaches.  Psychiatric/Behavioral:  Negative for behavioral problems.   All other systems reviewed and are negative.   Physical Exam   BP 133/68   Pulse 91   Temp 36.9 C (98.4 F) (Oral)   Resp 22   SpO2 99%   Physical Exam Vitals and nursing note reviewed.  Constitutional:      General: He is not in acute distress. HENT:  Head: Normocephalic.  Eyes:     Conjunctiva/sclera: Conjunctivae normal.  Neck:     Comments: There is no midline tenderness or step-offs. Cardiovascular:     Rate and Rhythm: Regular rhythm.     Pulses: Normal pulses.     Heart sounds: Normal heart sounds.  Pulmonary:     Effort: No respiratory distress.     Breath sounds: Normal breath sounds. No rales.  Chest:     Chest wall: No tenderness.  Abdominal:     General: There is no distension.     Tenderness: There is no abdominal tenderness. There is no guarding or rebound.     Hernia: No hernia is present.     Comments: Abdomen soft, nontender, normal active bowel  sounds.  Musculoskeletal:        General: No deformity.     Cervical back: Normal range of motion and neck supple. No rigidity or tenderness.     Comments: I did not appreciate any tenderness of either lower or upper extremity.  However, there was some slight bruising on the right knee which appears near and 1 in the left knee which appears old.  No tenderness.  No ligamentous laxity of either knee.  Full range of motion of all 4 extremities.  Normal distal neurovascular exam of all 4 extremities.  Skin:    General: Skin is warm.     Capillary Refill: Capillary refill takes 2 to 3 seconds.     Comments: Normal cap refill.  Neurological:     General: No focal deficit present.     Mental Status: Mental status is at baseline.     Cranial Nerves: No cranial nerve deficit.     Sensory: No sensory deficit.     Motor: No weakness.     Comments: There is no motor, sensory or cerebellar deficits.  Nonfocal neurologic exam.  GCS is 15.  Cranial nerves grossly intact.  Psychiatric:        Mood and Affect: Mood normal.     ED Course  Medical Decision Making Clinical picture suggests mechanical fall.  No obvious injury.  Will get plain films of the chest and pelvis and knees.  There is no clinical suspicion for intracranial or vertebral injury.  6:10 AM Patient is doing well.  No complaints.  Has no pain.  X-rays unremarkable.  Patient stable for discharge.  I have reviewed my clinical findings and studies and my clinical impression with the patient and family here present, with the patient's permission as applicable. The patient and family have expressed understanding that at this time there is no evidence for a more malignant underlying process, but the patient and family also understand that early in the process of a condition such as this, an initial workup can be falsely reassuring. I have counseled the patient and discussed follow-up with the patient and family, stressing the importance of  appropriate follow-up. I have also counseled the patient to return if worse or any concerns. Routine discharge counseling was given to the patient and the patient and family understand that worsening, changing or persistent symptoms should prompt an immediate call or follow up with their primary physician or return to the emergency department for reevaluation. Patient and family have expressed understanding.   6:13 AM Patient's son states that he wants his kidneys checked.  States that the patient has had some problems with his kidneys recently.  Patient has no complaints.  Once again, this was a mechanical fall.  However, given concern, we will check basic labs.  I have signed out patient to Dr. Maree. We have reviewed patient's presentation, history, physical, PMH, testing so far, treatment so far, and pending testing plus disposition plan.   Problems Addressed: Contusion of right knee, initial encounter: acute illness or injury that poses a threat to life or bodily functions Fall, initial encounter: acute illness or injury that poses a threat to life or bodily functions  Amount and/or Complexity of Data Reviewed Independent Historian: EMS Radiology: ordered and independent interpretation performed. Decision-making details documented in ED Course.    Details: No fracture  Risk Decision regarding hospitalization.     Procedures   No results found for this visit on 12/15/23 (from the past 4464 hours).   ED Results No results found for any visits on 12/15/23. XR Pelvis 1 Or 2 Views Result Date: 12/15/2023 Exam:  Pelvis  History:  Injury, fall  Technique:  AP pelvis  Comparison:  10/04/2007  Findings:  No evidence of an acute fracture. No joint dislocation or acute malalignment. There is moderate degenerative narrowing of the joint spaces in both hips. Pubic symphysis and sacroiliac joints are appropriately aligned. Multilevel degenerative changes in the lower lumbar spine.     Negative  for fracture or other acute process.    Signed (Electronic Signature): 12/15/2023 6:05 AM Signed By: Emeline KATHEE Milroy, MD  XR Knee 1 or 2 Views Bilateral Result Date: 12/15/2023 Exam:  Bilateral Knees  History:  Fall  Technique:  Frontal and lateral views of each knee  Comparison:  None.  Findings:  RIGHT KNEE:  No evidence of an acute fracture. No joint dislocation or acute malalignment. Advanced degenerative narrowing of the medial tibiofemoral joint space. Tricompartmental marginal osteophytes. Soft tissues are grossly unremarkable.  LEFT KNEE:  No acute fracture or dislocation. Moderate degenerative narrowing of the tibiofemoral joint spaces and patellofemoral joint space narrowing. There is no visible effusion. Soft tissues are grossly unremarkable.     1.    Negative for fracture or other acute process.  2.    Bilateral osteoarthritis.    Signed (Electronic Signature): 12/15/2023 6:04 AM Signed By: Emeline KATHEE Milroy, MD  XR Chest Portable Result Date: 12/15/2023 Exam:  Chest Single Frontal View  History:  Fall  Technique: Single view chest  Comparison:  08/05/2022  Findings:   Cardiac silhouette is enlarged and partially obscures the left lung base. There is no visible effusion or pneumothorax. Regional bones appear intact.     Mildly enlarged cardiac silhouette. No additional acute findings.    Signed (Electronic Signature): 12/15/2023 6:03 AM Signed By: Emeline KATHEE Milroy, MD   Medications Administered: Medications - No data to display  Discharge Medications (Medications Prescribed during this  ED visit and Patient's Home Medications) :    Your Medication List     ASK your doctor about these medications    acetaminophen  325 MG tablet Commonly known as: TYLENOL  Take 650 mg by mouth every six (6) hours as needed.   ascorbic acid  (vitamin C) 500 mg Cap Take 1 capsule by mouth daily.   aspirin  81 MG tablet Commonly known as: ECOTRIN Take 1 tablet (81 mg total) by mouth daily.   atorvastatin  20 MG  tablet Commonly known as: LIPITOR Take 1 tablet (20 mg total) by mouth in the morning.   celecoxib 200 MG capsule Commonly known as: CeleBREX Take 1 capsule (200 mg total) by mouth two (2) times a day.   cyanocobalamin  50 mcg  tablet Take 50 mcg by mouth daily.   diclofenac sodium 1 % gel Commonly known as: VOLTAREN 2 grams two to four times daily to painful joint   docosahexaenoic acid-epa 120-180 mg Cap Take 2 capsules by mouth two (2) times a day.   ferrous sulfate 325 (65 FE) MG tablet Take 1 tablet (325 mg total) by mouth in the morning.   fexofenadine 180 MG tablet Commonly known as: ALLEGRA Take 1 tablet (180 mg total) by mouth daily.   furosemide 20 MG tablet Commonly known as: LASIX Take 1 tablet (20 mg total) by mouth daily.   gabapentin 300 MG capsule Commonly known as: NEURONTIN Take 1 capsule (300 mg total) by mouth in the morning and 1 capsule (300 mg total) at noon and 1 capsule (300 mg total) in the evening.   memantine  5 MG tablet Commonly known as: NAMENDA  Take 1 tablet (5 mg total) by mouth two (2) times a day.   metFORMIN 1000 MG tablet Commonly known as: GLUCOPHAGE Take 1 tablet (1,000 mg total) by mouth two (2) times a day.   methocarbamol 500 MG tablet Commonly known as: ROBAXIN   omeprazole 40 MG capsule Commonly known as: PriLOSEC Take 1 capsule (40 mg total) by mouth Two (2) times a day.   zinc  sulfate 220 mg (50 mg elemental zinc ) capsule Commonly known as: ZINCATE Take 220 mg by mouth daily.          Cherie Ardeen Hanger, MD 12/15/23 [id number]    Cherie Ardeen Hanger, MD 12/15/23 [phone]

## 2023-12-22 NOTE — Progress Notes (Signed)
 Impression   1. Hypomagnesemia  Magnesium     2. Memory impairment  memantine  HCl (NAMENDA ) 10 mg tablet    3. Type 2 diabetes mellitus without complication, without long-term current use of insulin  (*)  Hemoglobin A1c   Hemoglobin A1c    4. Hypertension associated with diabetes (*)      5. Primary osteoarthritis involving multiple joints  Misc. Devices MISC    6. Anemia, unspecified type  CBC And Differential   CBC And Differential    7. CKD stage 3 due to type 2 diabetes mellitus (*)      8. Immunization due  Fluad Trivalent Adjuvanted    9. Multiple falls  Misc. Devices MISC    10. At risk for falls  Misc. Devices MISC    11. Chronic bilateral low back pain with bilateral sciatica  Misc. Devices MISC    12. Degeneration of intervertebral disc of lumbar region without discogenic back pain or lower extremity pain  Misc. Devices MISC    13. Agitation due to dementia (*)  risperiDONE (RISPERDAL) 1 MG tablet      Assessment & Plan  Plan   Hypomagnesemia-check magnesium  level  Osteoarthritis of multiple joints continue Celebrex  Chronic back pain degenerative disc disease lumbar continue with Celebrex and gabapentin  At risk for falls, multiple falls reviewed recent ED visit related to fall ordered prescription for them to order via medical supply store shower bench and toilet seat extension  Chronic kidney disease stage III recheck a CMP recent creatinine in ED 1.3  Agitation due to dementia reviewed recent ED visit related to this urinalysis clear blood work essentially normal but low magnesium  recheck level and mild anemia recheck CBC currently he is taking iron.  Zyprexa does not seem to be affordable for them we will provide Risperdal up to twice daily as needed they will definitely give it every night at bedtime and have the extra to take as needed for acute confusion episodes.  Urinalysis was all clear in the ED no sign of urinary tract infection which she has had  in the past.  Has an appointment next week with neurology back in July Namenda  was increased to 10 mg twice daily  Type 2 diabetes check an A1c recently very well-controlled continue metformin  Follow-up 4 weeks  Health Maintenance Due  Topic Date Due  . RSV Adult and Pregnancy (1 - 1-dose 75+ series) Never done  . Medicare Annual Wellness  09/09/2023  . Diabetes Annual Microalbumin/Creatinine Ratio  09/15/2023  . Diabetes Eye Exam  11/11/2023    See encounter after visit summary for patient specific instructions.  Follow up in about 6 weeks (around 02/02/2024).   Orders Placed This Encounter  Procedures  . Fluad Trivalent Adjuvanted  . Hemoglobin A1c  . Comprehensive Metabolic Panel  . CBC And Differential  . Magnesium      Patient's Medications       * Accurate as of December 22, 2023  2:29 PM. Reflects encounter med changes as of last refresh          New Prescriptions      Instructions  risperiDONE 1 MG tablet Commonly known as: RISPERDAL Started by: Comer Reid  1 mg, Oral, 2 times a day       Continued Medications      Instructions  ACCU-CHEK AVIVA Soln  No dose, route, or frequency recorded.   acetaminophen  325 mg tablet Commonly known as: TYLENOL   325 mg, 2 times a day  atorvastatin  20 mg tablet Commonly known as: LIPITOR  TAKE 1 TABLET AT BEDTIME   * Blood Glucose Monitoring Suppl Devi  Please dispense accu-check aviva plus meter to check sugar daily   * ACCU-CHEK GUIDE w/Device Kit  See admin instructions   celecoxib 200 mg capsule Commonly known as: CELEBREX  200 mg, Oral, 2 times a day   DROPSAFE ALCOHOL PREP 70 % Pads  USE AS DIRECTED AS NEEDED BEFORE INJECTIONS   ferrous sulfate 325 (65 FE) MG tablet  325 mg, Daily   fexofenadine 180 mg tablet Commonly known as: ALLEGRA  180 mg, Daily   fish oil 1000 mg Caps Commonly known as: SEA-OMEGA  2 capsules, 2 times a day   furosemide 20 mg tablet Commonly known as:  LASIX  TAKE 1 TABLET EVERY DAY   gabapentin 300 mg capsule Commonly known as: NEURONTIN  TAKE 1 CAPSULE THREE TIMES DAILY   * glucose blood test strip  Use to test blood glucose 3 time daily.  Please prescribe True Metrix test strips   * TRUE METRIX BLOOD GLUCOSE TEST test strip Generic drug: glucose blood  USE AS DIRECTED   * ACCU-CHEK GUIDE test strip Generic drug: glucose blood  TEST BLOOD SUGAR EVERY DAY AS DIRECTED   metFORMIN ER 500 mg 24 hr tablet Commonly known as: GLUCOPHAGE-XR  500 mg, Oral, Daily with breakfast   methocarbamol 500 mg tablet Commonly known as: ROBAXIN  500 mg, Oral, 3 times daily   omeprazole 40 mg capsule Commonly known as: PRILOSEC  40 mg, Oral, 2 times a day   SIMPLE DIAGNOSTICS LANCING DEV Misc  SMARTSIG:1 Device Topical As Directed   UNABLE TO FIND  [employer].  Take 1 tablet as needed for leg cramps.   vitamin B-12 50 mcg tablet Commonly known as: CYANOCOBALAMIN   50 mcg, 2 times a day   vitamin C 500 mg Caps capsule  1 capsule, Daily   zinc  sulfate 220 (50 Zn) MG capsule Commonly known as: ZINCATE  220 mg      * * This list has 5 medication(s) that are the same as other medications prescribed for you. Read the directions carefully, and ask your doctor or other care provider to review them with you.          Modified Medications      Instructions  memantine  HCl 10 mg tablet Commonly known as: NAMENDA  What changed:  medication strength how much to take Changed by: Katherine Hemberg  10 mg, Oral, 2 times a day   * Misc. Devices Misc What changed: Another medication with the same name was added. Make sure you understand how and when to take each. Changed by: Comer Reid  Rolling walker with seat  Fax to [employer]   * Misc. Devices Misc What changed: You were already taking a medication with the same name, and this prescription was added. Make sure you understand how and when to take  each. Changed by: Comer Reid  Shower bench Toilet seat extension      * * This list has 2 medication(s) that are the same as other medications prescribed for you. Read the directions carefully, and ask your doctor or other care provider to review them with you.          Discontinued Medications    OLANZapine 5 mg tablet Commonly known as: ZYPREXA Stopped by: Comer Reid        Risks, benefits, and alternatives of the medications and treatment  plan prescribed today were discussed, and patient expressed understanding. Plan follow-up as discussed or as needed if any worsening symptoms or change in condition.    A yearly health maintenance exam was recommended where appropriate.     Subjective   Patient ID:  Blake Collins is a 89 y.o. (DOB Dec 27, 1934) male.   Chief Complaint  Patient presents with  . Follow-up From Ed    Fall  . Immunizations    Flu shot     History of Present Illness   Results    Comes in for ED follow-up.  His daughter brings him in today she says she was on vacation with this occurred and something always seems to happen when she is out of town.  Her brother that lives with him is there when he is not working however he is not consistently checking in on his dad.  Her other brother consistently checks in on the patient whenever she is out of town.  She typically does want to check in on him regularly help him with medication management picking up groceries medications from the pharmacy helping him to be sure he is feeding his cats etc.  He went to the ED December 15, 2023 due to a fall.  Contusion of the right knee and was very confused and agitated at the time.  X-rays essentially negative urinalysis negative and blood work was done showing mild anemia and low magnesium  level.  Was prescribed Zyprexa 5 mg to centigrade tablet was very expensive at the pharmacy.  I then instead send in a 5 mg regular tablet which she says is still expensive  without a prior authorization she reports he has been taking it at bedtime they did go ahead and pay $60 for the bottle but they cannot continue to afford that every month.  She says it is helping him sleep.  Patient is doing well today she reports no other confusion episodes since then.  They do have a sooner appointment with neurology scheduled for next week.  Last visit was July increased Namenda  at that time to 10 mg twice daily and was told to follow-up in 6 months.  [employer] in-home evaluation advised him he could likely stay in home with family monitoring him closely if they could additionally add an a shower chair and toilet seat extension to prevent falls  Problem List, Past Medical History, Past Surgical History, Past Family History, Social History, Medications, and Allergies, were reviewed and updated as appropriate.   Review of Systems Review of Systems  Constitutional:  Negative for activity change, appetite change, fatigue and unexpected weight change.  Eyes:  Negative for visual disturbance.  Respiratory:  Negative for cough and shortness of breath.   Cardiovascular:  Negative for chest pain, palpitations and leg swelling.  Gastrointestinal:  Negative for abdominal pain.  Genitourinary:  Negative for dysuria.  Skin:  Negative for color change, rash and wound.  Neurological:  Negative for headaches.  Psychiatric/Behavioral:  Negative for dysphoric mood. The patient is not nervous/anxious.      Objective   BP 128/90 (BP Location: Right Upper Arm, Patient Position: Sitting)   Pulse 68   Temp 97.2 F (36.2 C) (Temporal)   Resp 18   Ht 5' 9 (1.753 m)   Wt 242 lb (109.8 kg)   SpO2 93%   BMI 35.74 kg/m  Physical Exam  Physical Exam Constitutional:      Appearance: He is well-developed.  Neck:     Vascular: No  carotid bruit.  Cardiovascular:     Rate and Rhythm: Normal rate and regular rhythm.  Musculoskeletal:     Cervical back: Normal range of motion and neck  supple.  Pulmonary:     Effort: Pulmonary effort is normal.     Breath sounds: Normal breath sounds.  Abdominal:     General: Bowel sounds are normal.     Palpations: Abdomen is soft.  Skin:    General: Skin is warm and dry.  Neurological:     Mental Status: He is alert and oriented to person, place, and time.  Psychiatric:        Mood and Affect: Mood normal.   Walking with a 4 pronged rolling walker with seat  Patient has baseline today alert and oriented x 3 --------------------------- Labs last 12 hours Recent Results (from the past 2 weeks)  CBC And Differential   Collection Time: 12/22/23  1:26 PM  Result Value Ref Range   WBC 4.9 (L) 5.1 - 10.8 thou/mcL   RBC 4.25 4.05 - 5.64 million/mcL   HGB 11.3 (L) 13.5 - 17.5 gm/dL   HCT 64.4 (L) 59.4 - 47.4 %   MCV 83.5 83.0 - 97.0 fL   MCH 26.7 (L) 28.0 - 33.0 pg   MCHC 31.9 (L) 32.0 - 36.0 gm/dL   Plt Ct 846 849 - 599 thou/mcL   RDW CV 15.8 (H) 11.7 - 15.2 %   NEUTROPHIL % 72.1 %   LYMPHOCYTE % 21.9 %   MID% 6.0 %   ABSOLUTE NEUTROPHIL COUNT 3.50 1.50 - 7.50 thou/mcL   ABSOLUTE LYMPHOCYTE COUNT 1.00 1.00 - 4.50 thou/mcL   ABSOLUTE MID <0.5 0.1 - 1.5 thou/mcL    Attestation   *Some images could not be shown.
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
## 2023-12-30 ENCOUNTER — Ambulatory Visit: Admitting: Physician Assistant

## 2023-12-30 ENCOUNTER — Encounter: Payer: Self-pay | Admitting: Physician Assistant

## 2023-12-30 VITALS — BP 104/62 | HR 82 | Resp 20 | Ht 70.0 in | Wt 242.0 lb

## 2023-12-30 DIAGNOSIS — F03A11 Unspecified dementia, mild, with agitation: Secondary | ICD-10-CM | POA: Insufficient documentation

## 2023-12-30 DIAGNOSIS — R296 Repeated falls: Secondary | ICD-10-CM | POA: Diagnosis not present

## 2023-12-30 MED ORDER — MEMANTINE HCL 5 MG PO TABS: ORAL_TABLET | ORAL | 11 refills | Status: AC

## 2023-12-30 NOTE — Patient Instructions (Addendum)
 It was a pleasure to see you today at our office.   Recommendations:  Decrease  Memantine  to 5  mg tablets twice a day  Follow up as scheduled 04/2024   MRI brain at [facility] [phone]     RECOMMENDATIONS FOR ALL PATIENTS WITH MEMORY PROBLEMS: 1. Continue to exercise (Recommend 30 minutes of walking everyday, or 3 hours every week) 2. Increase social interactions - continue going to [place] and enjoy social gatherings with friends and family 3. Eat healthy, avoid fried foods and eat more fruits and vegetables 4. Maintain adequate blood pressure, blood sugar, and blood cholesterol level. Reducing the risk of stroke and cardiovascular disease also helps promoting better memory. 5. Avoid stressful situations. Live a simple life and avoid aggravations. Organize your time and prepare for the next day in anticipation. 6. Sleep well, avoid any interruptions of sleep and avoid any distractions in the bedroom that may interfere with adequate sleep quality 7. Avoid sugar, avoid sweets as there is a strong link between excessive sugar intake, diabetes, and cognitive impairment We discussed the Mediterranean diet, which has been shown to help patients reduce the risk of progressive memory disorders and reduces cardiovascular risk. This includes eating fish, eat fruits and green leafy vegetables, nuts like almonds and hazelnuts, walnuts, and also use olive oil. Avoid fast foods and fried foods as much as possible. Avoid sweets and sugar as sugar use has been linked to worsening of memory function.  There is always a concern of gradual progression of memory problems. If this is the case, then we may need to adjust level of care according to patient needs. Support, both to the patient and caregiver, should then be put into place.        FALL PRECAUTIONS: Be cautious when walking. Scan the area for obstacles that may increase the risk of trips and falls. When getting up in the mornings, sit up  at the edge of the bed for a few minutes before getting out of bed. Consider elevating the bed at the head end to avoid drop of blood pressure when getting up. Walk always in a well-lit room (use night lights in the walls). Avoid area rugs or power cords from appliances in the middle of the walkways. Use a walker or a cane if necessary and consider physical therapy for balance exercise. Get your eyesight checked regularly.  FINANCIAL OVERSIGHT: Supervision, especially oversight when making financial decisions or transactions is also recommended.  HOME SAFETY: Consider the safety of the kitchen when operating appliances like stoves, microwave oven, and blender. Consider having supervision and share cooking responsibilities until no longer able to participate in those. Accidents with firearms and other hazards in the house should be identified and addressed as well.   ABILITY TO BE LEFT ALONE: If patient is unable to contact 911 operator, consider using LifeLine, or when the need is there, arrange for someone to stay with patients. Smoking is a fire hazard, consider supervision or cessation. Risk of wandering should be assessed by caregiver and if detected at any point, supervision and safe proof recommendations should be instituted.  MEDICATION SUPERVISION: Inability to self-administer medication needs to be constantly addressed. Implement a mechanism to ensure safe administration of the medications.   DRIVING: Regarding driving, in patients with progressive memory problems, driving will be impaired. We advise to have someone else do the driving if trouble finding directions or if minor accidents are reported. Independent driving assessment is available to determine safety of driving.  If you are interested in the driving assessment, you can contact the following:  [employer] in [place] [phone]  [facility] [phone] or  [phone]    Mediterranean Diet A Mediterranean diet refers to food and lifestyle choices that are based on the traditions of countries located on the [employer]. This way of eating has been shown to help prevent certain conditions and improve outcomes for people who have chronic diseases, like kidney disease and heart disease. What are tips for following this plan? Lifestyle  Cook and eat meals together with your family, when possible. Drink enough fluid to keep your urine clear or pale yellow. Be physically active every day. This includes: Aerobic exercise like running or swimming. Leisure activities like gardening, walking, or housework. Get 7-8 hours of sleep each night. If recommended by your health care provider, drink red wine in moderation. This means 1 glass a day for nonpregnant women and 2 glasses a day for men. A glass of wine equals 5 oz (150 mL). Reading food labels  Check the serving size of packaged foods. For foods such as rice and pasta, the serving size refers to the amount of cooked product, not dry. Check the total fat in packaged foods. Avoid foods that have saturated fat or trans fats. Check the ingredients list for added sugars, such as corn syrup. Shopping  At the grocery store, buy most of your food from the areas near the walls of the store. This includes: Fresh fruits and vegetables (produce). Grains, beans, nuts, and seeds. Some of these may be available in unpackaged forms or large amounts (in bulk). Fresh seafood. Poultry and eggs. Low-fat dairy products. Buy whole ingredients instead of prepackaged foods. Buy fresh fruits and vegetables in-season from local farmers markets. Buy frozen fruits and vegetables in resealable bags. If you do not have access to quality fresh seafood, buy precooked frozen shrimp or canned fish, such as tuna, salmon, or sardines. Buy small amounts of raw or cooked vegetables, salads, or olives from the deli or salad bar  at your store. Stock your pantry so you always have certain foods on hand, such as olive oil, canned tuna, canned tomatoes, rice, pasta, and beans. Cooking  Cook foods with extra-virgin olive oil instead of using butter or other vegetable oils. Have meat as a side dish, and have vegetables or grains as your main dish. This means having meat in small portions or adding small amounts of meat to foods like pasta or stew. Use beans or vegetables instead of meat in common dishes like chili or lasagna. Experiment with different cooking methods. Try roasting or broiling vegetables instead of steaming or sauteing them. Add frozen vegetables to soups, stews, pasta, or rice. Add nuts or seeds for added healthy fat at each meal. You can add these to yogurt, salads, or vegetable dishes. Marinate fish or vegetables using olive oil, lemon juice, garlic, and fresh herbs. Meal planning  Plan to eat 1 vegetarian meal one day each week. Try to work up to 2 vegetarian meals, if possible. Eat seafood 2 or more times a week. Have healthy snacks readily available, such as: Vegetable sticks with hummus. Greek yogurt. Fruit and nut trail mix. Eat balanced meals throughout the week. This includes: Fruit: 2-3 servings a day Vegetables: 4-5 servings a day Low-fat dairy: 2 servings a day Fish, poultry, or lean meat: 1 serving a day Beans and legumes: 2 or more servings a week Nuts and  seeds: 1-2 servings a day Whole grains: 6-8 servings a day Extra-virgin olive oil: 3-4 servings a day Limit red meat and sweets to only a few servings a month What are my food choices? Mediterranean diet Recommended Grains: Whole-grain pasta. Brown rice. Bulgar wheat. Polenta. Couscous. Whole-wheat bread. [place] Made[place]. Vegetables: Artichokes. Beets. Broccoli. Cabbage. Carrots. Eggplant. Green beans. Chard. Kale. Spinach. Onions. Leeks. Peas. Squash. Tomatoes. Peppers. Radishes. Fruits: Apples. Apricots. Avocado. Berries.  Bananas. Cherries. Dates. Figs. Grapes. Lemons. Melon. Oranges. Peaches. Plums. Pomegranate. Meats and other protein foods: Beans. Almonds. Sunflower seeds. Pine nuts. Peanuts. Cod. Salmon. Scallops. Shrimp. Tuna. Tilapia. Clams. Oysters. Eggs. Dairy: Low-fat milk. Cheese. Greek yogurt. Beverages: Water. Red wine. Herbal tea. Fats and oils: Extra virgin olive oil. Avocado oil. Grape seed oil. Sweets and desserts: [place] yogurt with honey. Baked apples. Poached pears. Trail mix. Seasoning and other foods: Basil. Cilantro. Coriander. Cumin. Mint. Parsley. Sage. Rosemary. Tarragon. Garlic. Oregano. Thyme. Pepper. Balsalmic vinegar. Tahini. Hummus. Tomato sauce. Olives. Mushrooms. Limit these Grains: Prepackaged pasta or rice dishes. Prepackaged cereal with added sugar. Vegetables: Deep fried potatoes (french fries). Fruits: Fruit canned in syrup. Meats and other protein foods: Beef. Pork. Lamb. Poultry with skin. Hot dogs. [place]. Dairy: Ice cream. Sour cream. Whole milk. Beverages: Juice. Sugar-sweetened soft drinks. Beer. Liquor and spirits. Fats and oils: Butter. Canola oil. Vegetable oil. Beef fat (tallow). Lard. Sweets and desserts: Cookies. Cakes. Pies. Candy. Seasoning and other foods: Mayonnaise. Premade sauces and marinades. The items listed may not be a complete list. Talk with your dietitian about what dietary choices are right for you. Summary The Mediterranean diet includes both food and lifestyle choices. Eat a variety of fresh fruits and vegetables, beans, nuts, seeds, and whole grains. Limit the amount of red meat and sweets that you eat. Talk with your health care provider about whether it is safe for you to drink red wine in moderation. This means 1 glass a day for nonpregnant women and 2 glasses a day for men. A glass of wine equals 5 oz (150 mL). This information is not intended to replace advice given to you by your health care provider. Make sure you discuss any questions you  have with your health care provider. Document Released: 11/06/2015 Document Revised: 12/09/2015 Document Reviewed: 11/06/2015 Elsevier Interactive Patient Education  2017 [employer].

## 2024-01-03 ENCOUNTER — Encounter: Payer: Self-pay | Admitting: Physician Assistant

## 2024-01-10 ENCOUNTER — Ambulatory Visit
Admission: RE | Admit: 2024-01-10 | Discharge: 2024-01-10 | Disposition: A | Discharge status: Home / Self Care | Source: Ambulatory Visit | Attending: Physician Assistant | Admitting: Physician Assistant

## 2024-01-23 ENCOUNTER — Telehealth: Payer: Self-pay | Admitting: Physician Assistant

## 2024-01-23 NOTE — Telephone Encounter (Signed)
 Pt daughter called and LM with AN. Wants to know the results of his MRI

## 2024-01-24 NOTE — Telephone Encounter (Signed)
 I advised daughter POA of results, Clarita. She thanked me for calling.

## 2024-01-24 NOTE — Telephone Encounter (Signed)
 Pt daughter called for  MRI Results

## 2024-02-08 ENCOUNTER — Emergency Department (HOSPITAL_COMMUNITY)
Admission: EM | Admit: 2024-02-08 | Discharge: 2024-02-08 | Disposition: A | Discharge status: Home / Self Care | Attending: Emergency Medicine | Admitting: Emergency Medicine

## 2024-02-08 ENCOUNTER — Encounter (HOSPITAL_COMMUNITY): Payer: Self-pay | Admitting: *Deleted

## 2024-02-08 ENCOUNTER — Other Ambulatory Visit: Payer: Self-pay

## 2024-02-08 DIAGNOSIS — Z7982 Long term (current) use of aspirin: Secondary | ICD-10-CM | POA: Insufficient documentation

## 2024-02-08 DIAGNOSIS — K644 Residual hemorrhoidal skin tags: Secondary | ICD-10-CM | POA: Insufficient documentation

## 2024-02-08 DIAGNOSIS — Z7901 Long term (current) use of anticoagulants: Secondary | ICD-10-CM | POA: Diagnosis not present

## 2024-02-08 LAB — CBC
HCT: 33.2 % — ABNORMAL LOW (ref 39.0–52.0)
Hemoglobin: 10.3 g/dL — ABNORMAL LOW (ref 13.0–17.0)
MCH: 27.9 pg (ref 26.0–34.0)
MCHC: 31 g/dL (ref 30.0–36.0)
MCV: 90 fL (ref 80.0–100.0)
Platelets: 118 K/uL — ABNORMAL LOW (ref 150–400)
RBC: 3.69 MIL/uL — ABNORMAL LOW (ref 4.22–5.81)
RDW: 16.4 % — ABNORMAL HIGH (ref 11.5–15.5)
WBC: 9 K/uL (ref 4.0–10.5)
nRBC: 0 % (ref 0.0–0.2)

## 2024-02-08 MED ORDER — HYDROCORTISONE (PERIANAL) 2.5 % EX CREA: 1.0000 | TOPICAL_CREAM | Freq: Two times a day (BID) | CUTANEOUS | 0 refills | Status: AC

## 2024-02-08 NOTE — ED Triage Notes (Signed)
 Pt with hemorrhoids that are bleeding since yesterday, hx of same in the past. Pt very HOH.

## 2024-02-08 NOTE — ED Provider Notes (Signed)
[facility] AT [facility] Provider Note   CSN: [id number] Arrival date & time: 02/08/24  1703     Patient presents with: Hemorrhoids   Blake Collins is a 89 y.o. male.   Patient is an 89 year old male with a history of previous lower GI bleed and anemia who presents to the ED for a painful hemorrhoid and bleeding that began yesterday.  Patient notes he felt a hemorrhoid appeared after having a bowel movement yesterday.  He notes he has not had any issues with constipation recently and this was not particularly hard bowel movement.  He states once he felt the hemorrhoid, there was a large amount of blood and there has been mild bleeding that has continued today.  Notes bleeding has been intermittent and not associated with bowel movement.  He notes that the hemorrhoid is painful still as well.  He did try some Preparation H cream.  He notes he has had intermittent issues with hemorrhoids but none recently.  Denies blood thinner use.  Denies abdominal pain.  No further complaints.    Prior to Admission medications   Medication Sig Start Date End Date Taking? Authorizing Provider  hydrocortisone  (ANUSOL -HC) 2.5 % rectal cream Place 1 Application rectally 2 (two) times daily for 10 days. 02/08/24 02/18/24 Yes Burlie Cajamarca, Thersia RAMAN, PA-C  acetaminophen  (TYLENOL ) 325 MG tablet Take 2 tablets (650 mg total) by mouth every 6 (six) hours as needed for mild pain or fever (or Fever >/= 101). 07/07/19   Pearlean Manus, MD  Apixaban  Starter Pack, 10mg  and 5mg , (ELIQUIS  DVT/PE STARTER PACK) Take as directed on package: start with two-5mg  tablets twice daily for 7 days. On day 8, switch to one-5mg  tablet twice daily. 07/07/19   Pearlean Manus, MD  aspirin  EC 81 MG tablet Take 1 tablet (81 mg total) by mouth daily with breakfast. 07/07/19   Emokpae, Courage, MD  atorvastatin  (LIPITOR) 20 MG tablet Take 1 tablet (20 mg total) by mouth every evening. 07/07/19   Pearlean Manus, MD   celecoxib (CELEBREX) 200 MG capsule Take 200 mg by mouth 2 (two) times daily.    [provider]  fexofenadine (ALLEGRA) 180 MG tablet Take 1 tablet by mouth daily.    [provider]  gabapentin (NEURONTIN) 300 MG capsule Take 300 mg by mouth daily. 05/16/19   [provider]  HYDROcodone -acetaminophen  (NORCO/VICODIN) 5-325 MG tablet Take 1 tablet by mouth 2 (two) times daily as needed for moderate pain or severe pain. 07/07/19   Pearlean Manus, MD  memantine  (NAMENDA ) 5 MG tablet Take 1 tablet twice a day 12/30/23   Wertman, Sara E, PA-C  metFORMIN (GLUCOPHAGE) 1000 MG tablet Take 1,000 mg by mouth 2 (two) times daily with a meal.      [provider]  methocarbamol (ROBAXIN) 500 MG tablet Take 500 mg by mouth at bedtime.    [provider]  Omega-3 Fatty Acids (FISH OIL) 1000 MG CAPS Take 2 capsules by mouth in the morning and at bedtime.    [provider]  omeprazole (PRILOSEC) 40 MG capsule Take 40 mg by mouth 2 (two) times daily. 04/04/19   [provider]  zinc  sulfate 220 (50 Zn) MG capsule Take 1 capsule (220 mg total) by mouth daily. 07/08/19   Pearlean Manus, MD    Allergies: Patient has no known allergies.    Review of Systems  Constitutional:  Negative for chills and fever.  Gastrointestinal:  Negative for abdominal pain,  constipation, diarrhea and vomiting.  Genitourinary:  Negative for dysuria, hematuria and testicular pain.  All other systems reviewed and are negative.   Updated Vital Signs BP 126/62   Pulse 75   Temp 98.4 F (36.9 C) (Oral)   Resp 18   Ht 5' 10 (1.778 m)   Wt 112.5 kg   SpO2 97%   BMI 35.58 kg/m   Physical Exam Constitutional:      Appearance: Normal appearance.  HENT:     Head: Normocephalic and atraumatic.     Ears:     Comments: Hard of hearing at baseline    Nose: Nose normal.  Cardiovascular:     Rate and Rhythm: Normal rate.  Pulmonary:     Effort: Pulmonary effort is  normal.  Genitourinary:    Comments: 1 small external hemorrhoid present in the left rectal area.  Minimal active bleeding.  Area is tender to palpation no anal fissures appreciated.  No gross blood noted. Neurological:     Mental Status: He is alert.     (all labs ordered are listed, but only abnormal results are displayed) Labs Reviewed  CBC - Abnormal; Notable for the following components:      Result Value   RBC 3.69 (*)    Hemoglobin 10.3 (*)    HCT 33.2 (*)    RDW 16.4 (*)    Platelets 118 (*)    All other components within normal limits    EKG: None  Radiology: No results found.    Medications Ordered in the ED - No data to display  Clinical Course as of 02/08/24 1911  Wed Feb 08, 2024  1858 Hemoglobin(!): 10.3 Stable compared to baseline [AY]    Clinical Course User Index [AY] Neysa Thersia RAMAN, PA-C                                Medical Decision Making Patient is an 89 year old male who presents to the ED for a painful bleeding hemorrhoid that began yesterday.  Please see detailed HPI above.  On exam patient is alert and in no acute distress.  Physical exam as noted above.  There is a small external hemorrhoid that appears to have drained recently.  There is very minimal active bleeding and no acute anal fissures.  Hemoglobin is 10.3 which is similar to patient's baseline.  He is not on acute blood thinners and the rectal bleeding has not been associated with any bowel movements.  He notes pain around the hemorrhoid area and bleeding has been very mildly localized to this area.  Less concerns for acute GI bleed.  Otherwise vital signs are stable and patient is well-appearing.  Stable for discharge home today.  Prescribed Anusol  cream.  Symptomatic care discussed.  Vies PCP follow-up in 1 week for recheck.  Strict return precautions provided for worsening symptoms.   Amount and/or Complexity of Data Reviewed Labs: ordered. Decision-making details documented in ED  Course.  Risk Prescription drug management.       Final diagnoses:  Residual hemorrhoidal skin tags    ED Discharge Orders          Ordered    hydrocortisone  (ANUSOL -HC) 2.5 % rectal cream  2 times daily        02/08/24 1859               Saraann Enneking S, PA-C 02/08/24 1911    Bernard Drivers, MD  02/09/24 1523  

## 2024-02-08 NOTE — Discharge Instructions (Addendum)
 Begin using cream as prescribed.  Begin taking a daily stool softener such as Colace to help with any hard bowel movements and to help with straining to help prevent hemorrhoids.  May take warm sitz bath's daily to help with pain and swelling as well.  The bleeding may persist for at least a week.  Continue to monitor.  Follow-up with PCP in 1 week for any continued concerns.  Return to ED sooner if any symptoms worsen including severe uncontrollable pain or severe bleeding in the stool/rectum area

## 2024-04-05 ENCOUNTER — Ambulatory Visit: Payer: Self-pay

## 2024-04-05 NOTE — Telephone Encounter (Signed)
 FYI Only or Action Required?: FYI only for provider: Refusing ED.  Patient is [facility] pt, not looking to establish with [facility].  Called Nurse Triage reporting Flank Pain.  Symptoms began today.  Interventions attempted: Nothing.  Symptoms are: rapidly worsening.  Triage Disposition: Go to ED Now (Notify PCP)  Patient/caregiver understands and will follow disposition?: No, refuses disposition       Copied from CRM [id number]. Topic: Clinical - Red Word Triage >> Apr 05, 2024 10:31 AM Blake Collins wrote: Red Word that prompted transfer to Nurse Triage: PT daughter Blake Collins stated PT has Dementia/Kidney Severe pain on both sides/woke up today this way  01/08 Reason for Disposition  [1] Sudden onset of severe flank pain AND [2] age > 60 years  Answer Assessment - Initial Assessment Questions This RN recommended pt be examined in hospital, pt daughter refusing. Advised pt call 911 or get to hospital asap if any new or worsening symptoms. Advised UC if refusing ED and no availability at his PCP [facility].     Speaking to pt daughter Blake Collins Just called me few minutes ago, said something wrong with kidneys because in pain Didn't mention anything else Think probably UTI more than likely Not seeming more confused than usual No injury to back that know of Really couldn't tell you how much pain Told me that need to take him somewhere and that it was his kidneys, that's all I know Was trying to avoid ER at all costs Already tried [facility], they were not willing to see him, they're full, completely booked  Protocols used: Flank Pain-A-AH

## 2024-05-01 ENCOUNTER — Ambulatory Visit: Admitting: Physician Assistant

## 2024-05-31 ENCOUNTER — Ambulatory Visit: Admitting: Physician Assistant
